# Patient Record
Sex: Female | Born: 1990
Health system: Southern US, Community
[De-identification: ages and names within clinical notes are randomized; demographics above are authoritative.]

## PROBLEM LIST (undated history)

## (undated) DIAGNOSIS — F419 Anxiety disorder, unspecified: Secondary | ICD-10-CM

## (undated) DIAGNOSIS — K219 Gastro-esophageal reflux disease without esophagitis: Secondary | ICD-10-CM

## (undated) HISTORY — DX: Anxiety disorder, unspecified: F41.9

## (undated) HISTORY — DX: Gastro-esophageal reflux disease without esophagitis: K21.9

## (undated) HISTORY — PX: COSMETIC SURGERY: SHX468

## (undated) HISTORY — PX: WISDOM TOOTH EXTRACTION: SHX21

---

## 2010-10-13 HISTORY — PX: BREAST REDUCTION SURGERY: SHX8

## 2019-07-11 LAB — HM PAP SMEAR

## 2019-10-04 ENCOUNTER — Ambulatory Visit: Payer: HRSA Program | Attending: Internal Medicine

## 2019-10-04 DIAGNOSIS — Z20828 Contact with and (suspected) exposure to other viral communicable diseases: Secondary | ICD-10-CM | POA: Insufficient documentation

## 2019-10-04 DIAGNOSIS — Z20822 Contact with and (suspected) exposure to covid-19: Secondary | ICD-10-CM

## 2019-10-05 LAB — NOVEL CORONAVIRUS, NAA: SARS-CoV-2, NAA: NOT DETECTED

## 2019-12-14 ENCOUNTER — Other Ambulatory Visit: Payer: Self-pay

## 2019-12-14 ENCOUNTER — Encounter: Payer: Self-pay | Admitting: Physician Assistant

## 2019-12-14 ENCOUNTER — Ambulatory Visit (INDEPENDENT_AMBULATORY_CARE_PROVIDER_SITE_OTHER): Payer: No Typology Code available for payment source | Admitting: Physician Assistant

## 2019-12-14 VITALS — BP 120/80 | HR 66 | Temp 97.7°F | Ht 69.25 in | Wt 195.2 lb

## 2019-12-14 DIAGNOSIS — F411 Generalized anxiety disorder: Secondary | ICD-10-CM | POA: Insufficient documentation

## 2019-12-14 DIAGNOSIS — Z23 Encounter for immunization: Secondary | ICD-10-CM | POA: Diagnosis not present

## 2019-12-14 DIAGNOSIS — F419 Anxiety disorder, unspecified: Secondary | ICD-10-CM | POA: Insufficient documentation

## 2019-12-14 NOTE — Progress Notes (Signed)
Haley Levine is a 29 y.o. female here to Establish care.  I acted as a Education administrator for [employer], PA-C Haley Pickler, LPN  History of Present Illness:   Chief Complaint  Patient presents with  . Establish Care  . Anxiety   Anxiety History of this. 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 She was on Zoloft 50 mg daily and has been off now x 2 months. She weaned herself off in Jan. She feels that she does not need medication at this time and feels like she has good coping skills. Sleeping well, no concerns currently or in the past. No prior talk therapy. No history of significant depression.  Triggers for anxiety: change, winter weather  Health Maintenance: Immunizations -- UTD, will give Tdap today Colonoscopy -- N/A Mammogram -- N/A PAP -- UTD, done at GYN 07/2019, normal per pt, will obtain report Bone Density -- N/A Weight -- Weight: 195 lb 4 oz (88.6 kg)   Depression screen [facility] 2/9 12/14/2019  Decreased Interest 0  Down, Depressed, Hopeless 0  PHQ - 2 Score 0    GAD 7 : Generalized Anxiety Score 12/14/2019  Nervous, Anxious, on Edge 0  Control/stop worrying 0  Worry too much - different things 1  Trouble relaxing 0  Restless 0  Easily annoyed or irritable 1  Afraid - awful might happen 0  Total GAD 7 Score 2  Anxiety Difficulty Not difficult at all   Other providers/specialists: Patient Care Team: Haley Coke, PA as PCP - General (Physician Assistant)   Past Medical History:  Diagnosis Date  . Anxiety   . GERD (gastroesophageal reflux disease)      Social History   Socioeconomic History  . Marital status: Married    Spouse name: Not on file  . Number of children: Not on file  . Years of education: Not on file  . Highest education level: Not on file  Occupational History  . Not on file  Tobacco Use  . Smoking status: Never Smoker  . Smokeless tobacco: Never Used  Substance and Sexual Activity  . Alcohol use: Yes    Comment: 3-4  drinks a week wine and beer  . Drug use: Never  . Sexual activity: Yes    Birth control/protection: Patch, Pill  Other Topics Concern  . 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 Difficulty of Paying Living Expenses: Not on file  Food Insecurity:   . Worried About Charity fundraiser in the Last Year: Not on file  . Ran Out of Food in the Last Year: Not on file  Transportation Needs:   . Lack of Transportation (Medical): Not on file  . Lack of Transportation (Non-Medical): Not on file  Physical Activity:   . Days of Exercise per Week: Not on file  . Minutes of Exercise per Session: Not on file  Stress:   . Feeling of Stress : Not on file  Social Connections:   . Frequency of Communication with Friends and Family: Not on file  . Frequency of Social Gatherings with Friends and Family: Not on file  . Attends Religious Services: Not on file  . Active Member of Clubs or Organizations: Not on file  . Attends Archivist Meetings: Not on file  . Marital Status: Not on file  Intimate Partner Violence:   .  Fear of Current or Ex-Partner: Not on file  . Emotionally Abused: Not on file  . Physically Abused: Not on file  . Sexually Abused: Not on file    Past Surgical History:  Procedure Laterality Date  . BREAST REDUCTION SURGERY Bilateral 2012    Family History  Problem Relation Age of Onset  . Osteoarthritis Mother   . Hypertension Mother   . Cancer Father   . Osteoarthritis Father   . Hyperlipidemia Father   . Cancer Maternal Grandfather        pancreatic  . Cancer Paternal Grandmother        colon  . Cancer Paternal Grandfather        breast, lung    No Known Allergies   Current Medications:   Current Outpatient Medications:  Marland Kitchen  Multiple Vitamin (MULTIVITAMIN) tablet, Take 1 tablet by mouth daily., Disp: , Rfl:  .  TRI-SPRINTEC 0.18/0.215/0.25 MG-35 MCG  tablet, Take 1 tablet by mouth daily., Disp: , Rfl:    Review of Systems:   ROS  Negative unless otherwise specified per HPI.  Vitals:   Vitals:   12/14/19 1425  BP: 120/80  Pulse: 66  Temp: 97.7 F (36.5 C)  TempSrc: Temporal  SpO2: 98%  Weight: 195 lb 4 oz (88.6 kg)  Height: 5' 9.25" (1.759 m)      Body mass index is 28.63 kg/m.  Physical Exam:   Physical Exam Vitals and nursing note reviewed.  Constitutional:      General: She is not in acute distress.    Appearance: She is well-developed. She is not ill-appearing or toxic-appearing.  Cardiovascular:     Rate and Rhythm: Normal rate and regular rhythm.     Pulses: Normal pulses.     Heart sounds: Normal heart sounds, S1 normal and S2 normal.     Comments: No LE edema Pulmonary:     Effort: Pulmonary effort is normal.     Breath sounds: Normal breath sounds.  Skin:    General: Skin is warm and dry.  Neurological:     Mental Status: She is alert.     GCS: GCS eye subscore is 4. GCS verbal subscore is 5. GCS motor subscore is 6.  Psychiatric:        Speech: Speech normal.        Behavior: Behavior normal. Behavior is cooperative.     Assessment and Plan:   Haley Levine was seen today for establish care and anxiety.  Diagnoses and all orders for this visit:  Generalized anxiety disorder Currently well controlled without medication. Follow-up as needed.  Need for prophylactic vaccination with combined diphtheria-tetanus-pertussis (DTP) vaccine -     Tdap vaccine greater than or equal to 7yo IM  . Reviewed expectations re: course of current medical issues. . Discussed self-management of symptoms. . Outlined signs and symptoms indicating need for more acute intervention. . Patient verbalized understanding and all questions were answered. . See orders for this visit as documented in the electronic medical record. . Patient received an After-Visit Summary.  CMA or LPN served as scribe during this visit.  History, Physical, and Plan performed by medical provider. The above documentation has been reviewed and is accurate and complete.  Haley Motto, PA-C

## 2019-12-21 ENCOUNTER — Telehealth: Payer: Self-pay | Admitting: Physician Assistant

## 2019-12-21 NOTE — Telephone Encounter (Signed)
[facility] HIM dept faxed request for medical records to [facility] 12/21/19

## 2020-02-21 ENCOUNTER — Encounter: Payer: Self-pay | Admitting: Physician Assistant

## 2020-02-22 ENCOUNTER — Other Ambulatory Visit: Payer: Self-pay | Admitting: Physician Assistant

## 2020-02-22 MED ORDER — SERTRALINE HCL 25 MG PO TABS: 25.0000 mg | ORAL_TABLET | Freq: Every day | ORAL | 1 refills | Status: DC

## 2020-03-28 MED FILL — SERTRALINE HCL 25 MG TABLET: 25 | 30 days supply | Qty: 30 | Fill #0

## 2020-04-03 MED FILL — PROMETHAZINE 12.5 MG TABLET: 12.5 | 20 days supply | Qty: 60 | Fill #0

## 2020-04-12 ENCOUNTER — Encounter: Payer: Self-pay | Admitting: Obstetrics & Gynecology

## 2020-04-18 LAB — OB RESULTS CONSOLE RPR: RPR: NONREACTIVE

## 2020-04-18 LAB — OB RESULTS CONSOLE GC/CHLAMYDIA
Chlamydia: NEGATIVE
Gonorrhea: NEGATIVE

## 2020-04-18 LAB — OB RESULTS CONSOLE HIV ANTIBODY (ROUTINE TESTING): HIV: NONREACTIVE

## 2020-04-18 LAB — OB RESULTS CONSOLE HEPATITIS B SURFACE ANTIGEN: Hepatitis B Surface Ag: NEGATIVE

## 2020-04-18 LAB — OB RESULTS CONSOLE ANTIBODY SCREEN: Antibody Screen: NEGATIVE

## 2020-04-18 LAB — OB RESULTS CONSOLE ABO/RH: RH Type: POSITIVE

## 2020-04-18 LAB — OB RESULTS CONSOLE RUBELLA ANTIBODY, IGM: Rubella: IMMUNE

## 2020-04-19 MED FILL — SCOPOLAMINE 1 MG/3DAYS PT72: 1 | 12 days supply | Qty: 4 | Fill #0

## 2020-04-23 MED FILL — PROMETHAZINE 12.5 MG TABLET: 12.5 | 20 days supply | Qty: 60 | Fill #1

## 2020-04-25 ENCOUNTER — Encounter: Payer: Self-pay | Admitting: Physician Assistant

## 2020-04-25 ENCOUNTER — Telehealth (INDEPENDENT_AMBULATORY_CARE_PROVIDER_SITE_OTHER): Payer: No Typology Code available for payment source | Admitting: Physician Assistant

## 2020-04-25 ENCOUNTER — Other Ambulatory Visit: Payer: Self-pay | Admitting: Physician Assistant

## 2020-04-25 VITALS — Ht 69.25 in | Wt 188.0 lb

## 2020-04-25 DIAGNOSIS — F411 Generalized anxiety disorder: Secondary | ICD-10-CM

## 2020-04-25 MED ORDER — SERTRALINE HCL 25 MG PO TABS: 25.0000 mg | ORAL_TABLET | Freq: Every day | ORAL | 1 refills | Status: DC

## 2020-04-25 MED FILL — SERTRALINE HCL 25 MG TABLET: 25 | 90 days supply | Qty: 90 | Fill #0

## 2020-04-25 NOTE — Progress Notes (Signed)
Virtual Visit via Video   I connected with Haley Levine on 04/25/20 at  4:00 PM EDT by a video enabled telemedicine application and verified that I am speaking with the correct person using two identifiers. Location patient: Home Location provider: [facility], Office Persons participating in the virtual visit: Haley Levine, Haley Pavlov PA-C,Donna Orphanos, LPN   I discussed the limitations of evaluation and management by telemedicine and the availability of in person appointments. The patient expressed understanding and agreed to proceed.  I acted as a Neurosurgeon for [employer], PA-C [employer], LPN   Subjective:   HPI:   Anxiety Pt following up, currently taking Zoloft 25 mg daily, was restarted on this in May 2021. Pt is feeling good on this dose and denies any side effects from medication. Denies SI/HI. Pt is pregnant and due in Feb. Having significant nausea/vomiting with her pregnancy thus far.  ROS: See pertinent positives and negatives per HPI.  GAD 7 : Generalized Anxiety Score 04/25/2020 12/14/2019  Nervous, Anxious, on Edge 0 0  Control/stop worrying 0 0  Worry too much - different things 0 1  Trouble relaxing 0 0  Restless 0 0  Easily annoyed or irritable 0 1  Afraid - awful might happen 0 0  Total GAD 7 Score 0 2  Anxiety Difficulty Not difficult at all Not difficult at all   ]  Patient Active Problem List   Diagnosis Date Noted  . Generalized anxiety disorder 12/14/2019    Social History   Tobacco Use  . Smoking status: Never Smoker  . Smokeless tobacco: Never Used  Substance Use Topics  . Alcohol use: Yes    Comment: 3-4 drinks a week wine and beer    Current Outpatient Medications:  Marland Kitchen  Multiple Vitamin (MULTIVITAMIN) tablet, Take 1 tablet by mouth daily., Disp: , Rfl:  .  promethazine (PHENERGAN) 12.5 MG tablet, Take 12.5 mg by mouth 3 (three) times daily., Disp: , Rfl:  .  sertraline (ZOLOFT) 25 MG tablet, Take 1 tablet  (25 mg total) by mouth daily., Disp: 90 tablet, Rfl: 1  No Known Allergies  Objective:   VITALS: Per patient if applicable, see vitals. GENERAL: Alert, appears well and in no acute distress. HEENT: Atraumatic, conjunctiva clear, no obvious abnormalities on inspection of external nose and ears. NECK: Normal movements of the head and neck. CARDIOPULMONARY: No increased WOB. Speaking in clear sentences. I:E ratio WNL.  MS: Moves all visible extremities without noticeable abnormality. PSYCH: Pleasant and cooperative, well-groomed. Speech normal rate and rhythm. Affect is appropriate. Insight and judgement are appropriate. Attention is focused, linear, and appropriate.  NEURO: CN grossly intact. Oriented as arrived to appointment on time with no prompting. Moves both UE equally.  SKIN: No obvious lesions, wounds, erythema, or cyanosis noted on face or hands.  Assessment and Plan:   [name] was seen today for anxiety.  Diagnoses and all orders for this visit:  Generalized anxiety disorder  Other orders -     sertraline (ZOLOFT) 25 MG tablet; Take 1 tablet (25 mg total) by mouth daily.   Overall doing well.  Continue current prescription.  Follow-up in 3 months if she would like to change dose, sooner if concerns.  . Reviewed expectations re: course of current medical issues. . Discussed self-management of symptoms. . Outlined signs and symptoms indicating need for more acute intervention. . Patient verbalized understanding and all questions were answered. Marland Kitchen Health Maintenance issues including appropriate healthy diet, exercise,  and smoking avoidance were discussed with patient. . See orders for this visit as documented in the electronic medical record.  I discussed the assessment and treatment plan with the patient. The patient was provided an opportunity to ask questions and all were answered. The patient agreed with the plan and demonstrated an understanding of the instructions.    The patient was advised to call back or seek an in-person evaluation if the symptoms worsen or if the condition fails to improve as anticipated.   CMA or LPN served as scribe during this visit. History, Physical, and Plan performed by medical provider. The above documentation has been reviewed and is accurate and complete.   [place], [place] 04/25/2020

## 2020-05-10 LAB — HM PAP SMEAR

## 2020-05-14 MED FILL — SCOPOLAMINE 1 MG/3DAYS PT72: 1 | 12 days supply | Qty: 4 | Fill #0

## 2020-05-16 MED FILL — PROMETHAZINE 12.5 MG TABLET: 12.5 | 20 days supply | Qty: 60 | Fill #2

## 2020-05-29 ENCOUNTER — Other Ambulatory Visit: Payer: Self-pay | Admitting: Obstetrics & Gynecology

## 2020-05-29 DIAGNOSIS — Z363 Encounter for antenatal screening for malformations: Secondary | ICD-10-CM

## 2020-05-29 DIAGNOSIS — Z3A18 18 weeks gestation of pregnancy: Secondary | ICD-10-CM

## 2020-05-29 DIAGNOSIS — R9389 Abnormal findings on diagnostic imaging of other specified body structures: Secondary | ICD-10-CM

## 2020-05-31 MED FILL — SCOPOLAMINE 1 MG/3DAYS PT72: 1 | 12 days supply | Qty: 4 | Fill #1

## 2020-06-14 MED FILL — PROMETHAZINE 12.5 MG TABLET: 12.5 | 20 days supply | Qty: 60 | Fill #0

## 2020-06-22 ENCOUNTER — Encounter: Payer: Self-pay | Admitting: *Deleted

## 2020-06-26 ENCOUNTER — Other Ambulatory Visit: Payer: Self-pay

## 2020-06-26 ENCOUNTER — Encounter: Payer: Self-pay | Admitting: *Deleted

## 2020-06-26 ENCOUNTER — Ambulatory Visit: Payer: Self-pay | Admitting: Genetic Counselor

## 2020-06-26 ENCOUNTER — Other Ambulatory Visit: Payer: Self-pay | Admitting: *Deleted

## 2020-06-26 ENCOUNTER — Ambulatory Visit: Payer: No Typology Code available for payment source | Admitting: *Deleted

## 2020-06-26 ENCOUNTER — Ambulatory Visit (HOSPITAL_BASED_OUTPATIENT_CLINIC_OR_DEPARTMENT_OTHER): Payer: No Typology Code available for payment source | Admitting: Genetic Counselor

## 2020-06-26 ENCOUNTER — Ambulatory Visit: Payer: No Typology Code available for payment source | Attending: Obstetrics & Gynecology

## 2020-06-26 VITALS — BP 129/68 | HR 89

## 2020-06-26 DIAGNOSIS — Z3A18 18 weeks gestation of pregnancy: Secondary | ICD-10-CM | POA: Insufficient documentation

## 2020-06-26 DIAGNOSIS — Z363 Encounter for antenatal screening for malformations: Secondary | ICD-10-CM | POA: Diagnosis not present

## 2020-06-26 DIAGNOSIS — Z315 Encounter for genetic counseling: Secondary | ICD-10-CM | POA: Diagnosis not present

## 2020-06-26 DIAGNOSIS — O285 Abnormal chromosomal and genetic finding on antenatal screening of mother: Secondary | ICD-10-CM | POA: Insufficient documentation

## 2020-06-26 DIAGNOSIS — O280 Abnormal hematological finding on antenatal screening of mother: Secondary | ICD-10-CM | POA: Insufficient documentation

## 2020-06-26 DIAGNOSIS — Z362 Encounter for other antenatal screening follow-up: Secondary | ICD-10-CM

## 2020-06-26 DIAGNOSIS — R9389 Abnormal findings on diagnostic imaging of other specified body structures: Secondary | ICD-10-CM | POA: Insufficient documentation

## 2020-06-26 NOTE — Progress Notes (Signed)
06/26/2020  Haley Levine Nov 20, 1990 MRN: [id number] DOV: 06/26/2020  Haley Levine presented to the [facility] for a genetics consultation regarding her abnormal noninvasive prenatal screening (NIPS) result. Haley Levine presented to her appointment alone.   Indication for genetic counseling - Atypical finding on sex chromosomes on NIPS  Prenatal history  Haley Levine is a G18P0, 29 y.o. female. Her current pregnancy has completed [redacted]w[redacted]d (Estimated Date of Delivery: 11/25/20).  Haley Levine denied exposure to environmental toxins or chemical agents. She denied the use of alcohol, tobacco or street drugs. She reported taking Zoloft and Phenergan. She denied significant viral illnesses, fevers, and bleeding during the course of her pregnancy. Her medical and surgical histories were noncontributory.  Family History  A three generation pedigree was drafted and reviewed. The family history is remarkable for the following:  - Haley Levine's father was diagnosed with Parkinson's disease at 29 years of age. Her paternal grandfather also reportedly had Parkinson's disease diagnosed at age 61. Approximately 10-15% of cases of Parkinson's disease are hereditary. Several genes associated with Parkinson's disease are inherited in an autosomal dominant pattern, which is consistent with Haley Levine's family history. If her father has a change in an autosomal dominant gene associated with Parkinson's, Haley Levine would have a 50% chance of inheriting this change and being at risk of developing Parkinson's disease herself. I offered Haley Levine a referral to adult genetics to discuss her family history of Parkinson's disease and genetic testing options that are available, which she declined at this time.  The remaining family histories were reviewed and found to be noncontributory for birth defects, intellectual disability, recurrent pregnancy loss, and known genetic conditions.    The  patient's ethnicity is [place]. The father of the pregnancy's ethnicity is Caucasian. Ashkenazi Jewish ancestry and consanguinity were denied. Pedigree will be scanned under Media.  Discussion  NIPS result:  HaleyO'Dellwas referred for a genetic counseling consultation as shehad Panoramanoninvasive prenatal screening (NIPS)through Haley Levine that demonstrated an atypical finding on the sex chromosomes. The laboratory was unable to determine if this finding was suspected to be maternal or fetal in nature. These results also showeda less than 1 in 10,000 risk for trisomies 21, 18 and 13, a low risk for triploidy, and a 1 in9000 risk for 22q11.2 deletion syndrome. A female fetus was predicted on NIPS.  We reviewed thatNIPSanalyzes cell-free fetal DNAfrom the placentafound in the maternal bloodstream duringpregnancy.NIPS is used to determine the risk for missing or extra fetal/placental chromosomal material for a specific subset of chromosomes.However, maternal DNA is also present in a NIPS sample. Based on this, we discussed that there are several possibilities that could warrant Haley Levine's abnormal NIPS result. One possibility is the fetus having a genetic change involving her sex chromosomes. Another possibility is Haley Levine herself having a genetic change involving her sex chromosomes. A second possibility is fetal or maternal mosaicism for a change involving the sex chromosomes. Mosaicism occurs when not every cell in the body is genetically identical; some cells in the body may have a genetic change involving the sex chromosomes, whereas others may have normal sex chromosomes. A third possibility is that of confined placental mosaicism, or a result representative of the placenta only rather than the fetus. Finally, it is possible that this is a false positive result.  We reviewed that even if the fetus were to have a sex chromosome abnormality, it would not be possible for [place] to predict if  this  would be a benign change or one that would have implications for health or development based on this NIPS result alone.We also reviewed the limitations of NIPS, including the fact that it is not diagnostic andthat itdoes not identify all genetic conditions. We discussed that without knowing the precisereason forthis abnormal NIPS result, we are limited in understanding if there may beany potentially clinically relevant concerns for the fetus.  Testing options:  We reviewed available testing options to attempt to get more information to clarify this finding. Firstly, Haley Levine was informed that diagnostic testing via amniocentesis would be the only way to determine if the fetus has a chromosomal abnormality involving the sex chromosomes prenatally. We discussed the technical aspects of the procedure and quoted up to a 1 in 500 (0.2%) risk for spontaneous pregnancy loss or other adverse pregnancy outcomes as a result of amniocentesis. Cultured cells from an amniocentesis sample allow for the visualization of a fetal karyotype, which can detect >99% of large chromosomal aberrations. Chromosomal microarray can also be performed to identify smaller deletions or duplications offetalchromosomal material that fall below the resolution of karyotype. Haley Levine was informed that if a sex chromosome abnormality were identified, we would then be able to research whether the finding is a benign change orone thatcould have implications for the fetus's postnatal health or development.   Haley Levine was also made aware that should amniocentesis not be desired, she can continue with standard ultrasounds to monitor for fetal anomalies. She may wait to pursue genetic testing postnatally if clinically indicated. Finally, Haley Levine was informed that we could consider pursuing a maternal karyotype to determine if she herself has a genetic change involving her sex chromosomes.  Ultrasound findings:  A complete  ultrasound was performed today following our visit. The ultrasound report will be sent under separate cover. There were no visualized fetal anomalies or markers suggestive of aneuploidy. However, suboptimal views of the fetal face were seen, with some images suggestive of a possible cleft lip and others appearing normal. Haley Levine will return for a follow-up ultrasound in four weeks to clarify whether or not the fetus has a cleft lip. We discussed that if the fetus does have a cleft lip, it may not be associated with her atypical NIPS result. Cleft lip can occur as an isolated defect, but also is associated with many genetic disorders. If cleft lip is confirmed at her future ultrasound, we can discuss this finding in more detail at that time.   Plan:  Haley Levine declined amniocentesis and maternal karyotype today. She wanted to discuss her testing options with her husband before making a firm decision about testing. However, she informed me that she would likely not consider amniocentesis unless some sort of physical deformity were identified on ultrasound. She is aware that amniocentesis remains an option throughout the end of her pregnancy and that she may opt to undergo the procedure at any time. She also understands that screening tests, including NIPS and ultrasound, cannot rule out all birth defects or genetic syndromes.   Following the session, I emailed Haley Levine a summary of what was discussed during today's session. I encouraged her to contact me if she decides that she would like to undergo further testing or if she or her husband have any additional questions.  I counseled Haley Levine regarding the above risks and available options. The approximate face-to-face time with the genetic counselor was 35 minutes.  In summary:  Discussed NIPS result   Atypical finding  on the sex chromosomes  Uncertain if finding is suspected to be placental/fetal or maternal in origin  Reduction in risk for  Down syndrome, trisomy 52, trisomy 68, triploidy, and 22q11.2 deletion syndrome  Offered additional testing and screening  Declined maternal karyotype & amniocentesis at this time  May consider undergoing amniocentesis if physical abnormality identified on ultrasound  Reviewed results of ultrasound  Suboptimal views of face with some images suggestive of possible cleft lip with others appearing normal  No other fetal anomalies or markers seen  Will return for a follow-up ultrasound in four weeks  Will discuss cleft lip in more detail if confirmed at future ultrasound  Reviewed family history concerns  Declined referral to adult genetics for family history of Parkinson's disease in her father and paternal grandfather   Gershon Crane, MS, [facility] Dentist

## 2020-07-24 ENCOUNTER — Ambulatory Visit: Payer: No Typology Code available for payment source | Attending: Maternal & Fetal Medicine

## 2020-07-24 ENCOUNTER — Other Ambulatory Visit: Payer: Self-pay

## 2020-07-24 DIAGNOSIS — O321XX0 Maternal care for breech presentation, not applicable or unspecified: Secondary | ICD-10-CM | POA: Diagnosis not present

## 2020-07-24 DIAGNOSIS — O289 Unspecified abnormal findings on antenatal screening of mother: Secondary | ICD-10-CM | POA: Diagnosis not present

## 2020-07-24 DIAGNOSIS — Z362 Encounter for other antenatal screening follow-up: Secondary | ICD-10-CM | POA: Diagnosis not present

## 2020-07-24 DIAGNOSIS — Z3A22 22 weeks gestation of pregnancy: Secondary | ICD-10-CM

## 2020-08-13 MED FILL — SERTRALINE HCL 25 MG TABLET: 25 | 90 days supply | Qty: 90 | Fill #1

## 2020-10-13 NOTE — L&D Delivery Note (Signed)
Delivery Note At 9:42 AM a viable female was delivered via Vaginal, Spontaneous (Presentation:   Occiput Anterior).  APGAR: 9, 9; weight  pending.   Placenta status: Spontaneous, Intact.  Cord: 3 vessels with the following complications: Nuchal x 1 - loose, delivered through  Cord pH: not sent  Anesthesia: Epidural Episiotomy: None Lacerations:  vaginal Suture Repair: 2.0 3.0 vicryl Est. Blood Loss (mL):  400cc   It's a girl - "Parker"!!   Mom to postpartum.  Baby to Couplet care / Skin to Skin.  Ranae Pila 11/26/2020, 10:11 AM

## 2020-10-30 LAB — OB RESULTS CONSOLE GBS: GBS: NEGATIVE

## 2020-11-14 ENCOUNTER — Encounter: Payer: Self-pay | Admitting: Physician Assistant

## 2020-11-14 ENCOUNTER — Other Ambulatory Visit: Payer: Self-pay | Admitting: Physician Assistant

## 2020-11-14 MED ORDER — SERTRALINE HCL 25 MG PO TABS: 25.0000 mg | ORAL_TABLET | Freq: Every day | ORAL | 1 refills | Status: DC

## 2020-11-14 MED FILL — SERTRALINE HCL 25 MG TABLET: 25 | 90 days supply | Qty: 90 | Fill #0

## 2020-11-14 NOTE — Telephone Encounter (Signed)
Okay to refill Zoloft?   Last OV 04/2020.

## 2020-11-21 ENCOUNTER — Telehealth (HOSPITAL_COMMUNITY): Payer: Self-pay | Admitting: *Deleted

## 2020-11-21 NOTE — Telephone Encounter (Signed)
Preadmission screen  

## 2020-11-22 ENCOUNTER — Encounter (HOSPITAL_COMMUNITY): Payer: Self-pay | Admitting: *Deleted

## 2020-11-22 ENCOUNTER — Telehealth (HOSPITAL_COMMUNITY): Payer: Self-pay | Admitting: *Deleted

## 2020-11-22 NOTE — Telephone Encounter (Signed)
Per EC Mullen IP if pt brings the information she provided to [facility] for her positive Covid test, she is not required to retest and can be off of isolation.

## 2020-11-22 NOTE — Telephone Encounter (Signed)
Preadmission screen  

## 2020-11-26 ENCOUNTER — Inpatient Hospital Stay (HOSPITAL_COMMUNITY)
Admission: AD | Admit: 2020-11-26 | Discharge: 2020-11-28 | DRG: 807 | Disposition: A | Discharge status: Home / Self Care | Payer: No Typology Code available for payment source | Attending: Obstetrics and Gynecology | Admitting: Obstetrics and Gynecology

## 2020-11-26 ENCOUNTER — Inpatient Hospital Stay (HOSPITAL_COMMUNITY): Payer: No Typology Code available for payment source | Admitting: Anesthesiology

## 2020-11-26 ENCOUNTER — Encounter (HOSPITAL_COMMUNITY): Payer: Self-pay | Admitting: Obstetrics and Gynecology

## 2020-11-26 ENCOUNTER — Other Ambulatory Visit: Payer: Self-pay

## 2020-11-26 DIAGNOSIS — Z3490 Encounter for supervision of normal pregnancy, unspecified, unspecified trimester: Secondary | ICD-10-CM

## 2020-11-26 DIAGNOSIS — Z20822 Contact with and (suspected) exposure to covid-19: Secondary | ICD-10-CM | POA: Diagnosis present

## 2020-11-26 DIAGNOSIS — Z3A40 40 weeks gestation of pregnancy: Secondary | ICD-10-CM | POA: Diagnosis not present

## 2020-11-26 DIAGNOSIS — O26893 Other specified pregnancy related conditions, third trimester: Secondary | ICD-10-CM | POA: Diagnosis present

## 2020-11-26 LAB — CBC
HCT: 37.9 % (ref 36.0–46.0)
Hemoglobin: 13 g/dL (ref 12.0–15.0)
MCH: 30.6 pg (ref 26.0–34.0)
MCHC: 34.3 g/dL (ref 30.0–36.0)
MCV: 89.2 fL (ref 80.0–100.0)
Platelets: 128 10*3/uL — ABNORMAL LOW (ref 150–400)
RBC: 4.25 MIL/uL (ref 3.87–5.11)
RDW: 13.4 % (ref 11.5–15.5)
WBC: 12.3 10*3/uL — ABNORMAL HIGH (ref 4.0–10.5)
nRBC: 0 % (ref 0.0–0.2)

## 2020-11-26 LAB — TYPE AND SCREEN
ABO/RH(D): AB POS
Antibody Screen: NEGATIVE

## 2020-11-26 LAB — RESP PANEL BY RT-PCR (FLU A&B, COVID) ARPGX2
Influenza A by PCR: NEGATIVE
Influenza B by PCR: NEGATIVE
SARS Coronavirus 2 by RT PCR: NEGATIVE

## 2020-11-26 LAB — RPR: RPR Ser Ql: NONREACTIVE

## 2020-11-26 MED ORDER — WITCH HAZEL-GLYCERIN EX PADS
1.0000 "application " | MEDICATED_PAD | CUTANEOUS | Status: DC | PRN
  Administered 2020-11-26 – 2020-11-27 (×2): 1 via TOPICAL

## 2020-11-26 MED ORDER — ONDANSETRON HCL 4 MG/2ML IJ SOLN: 4.0000 mg | INTRAMUSCULAR | Status: DC | PRN

## 2020-11-26 MED ORDER — OXYCODONE-ACETAMINOPHEN 5-325 MG PO TABS: 1.0000 | ORAL_TABLET | ORAL | Status: DC | PRN

## 2020-11-26 MED ORDER — OXYTOCIN-SODIUM CHLORIDE 30-0.9 UT/500ML-% IV SOLN
1.0000 m[IU]/min | INTRAVENOUS | Status: DC
  Administered 2020-11-26: 2 m[IU]/min via INTRAVENOUS

## 2020-11-26 MED ORDER — OXYCODONE-ACETAMINOPHEN 5-325 MG PO TABS
2.0000 | ORAL_TABLET | ORAL | Status: DC | PRN
Start: 2020-11-26 — End: 2020-11-26

## 2020-11-26 MED ORDER — ONDANSETRON HCL 4 MG PO TABS: 4.0000 mg | ORAL_TABLET | ORAL | Status: DC | PRN

## 2020-11-26 MED ORDER — DIPHENHYDRAMINE HCL 25 MG PO CAPS: 25.0000 mg | ORAL_CAPSULE | Freq: Four times a day (QID) | ORAL | Status: DC | PRN

## 2020-11-26 MED ORDER — PRENATAL MULTIVITAMIN CH
1.0000 | ORAL_TABLET | Freq: Every day | ORAL | Status: DC
  Filled 2020-11-26 (×2): qty 1

## 2020-11-26 MED ORDER — IBUPROFEN 600 MG PO TABS
600.0000 mg | ORAL_TABLET | Freq: Four times a day (QID) | ORAL | Status: DC
  Administered 2020-11-26 – 2020-11-28 (×9): 600 mg via ORAL
  Filled 2020-11-26 (×8): qty 1

## 2020-11-26 MED ORDER — TETANUS-DIPHTH-ACELL PERTUSSIS 5-2.5-18.5 LF-MCG/0.5 IM SUSY: 0.5000 mL | PREFILLED_SYRINGE | Freq: Once | INTRAMUSCULAR | Status: DC

## 2020-11-26 MED ORDER — ACETAMINOPHEN 325 MG PO TABS
650.0000 mg | ORAL_TABLET | ORAL | Status: DC | PRN
  Administered 2020-11-27 – 2020-11-28 (×5): 650 mg via ORAL
  Filled 2020-11-26 (×5): qty 2

## 2020-11-26 MED ORDER — SIMETHICONE 80 MG PO CHEW
80.0000 mg | CHEWABLE_TABLET | ORAL | Status: DC | PRN
Start: 2020-11-26 — End: 2020-11-28

## 2020-11-26 MED ORDER — BENZOCAINE-MENTHOL 20-0.5 % EX AERO: 1.0000 "application " | INHALATION_SPRAY | CUTANEOUS | Status: DC | PRN

## 2020-11-26 MED ORDER — LACTATED RINGERS IV SOLN: 500.0000 mL | Freq: Once | INTRAVENOUS | Status: DC

## 2020-11-26 MED ORDER — LACTATED RINGERS IV SOLN: INTRAVENOUS | Status: DC

## 2020-11-26 MED ORDER — HYDROXYZINE HCL 50 MG PO TABS: 50.0000 mg | ORAL_TABLET | Freq: Four times a day (QID) | ORAL | Status: DC | PRN

## 2020-11-26 MED ORDER — LIDOCAINE HCL (PF) 1 % IJ SOLN: 30.0000 mL | INTRAMUSCULAR | Status: DC | PRN

## 2020-11-26 MED ORDER — OXYCODONE HCL 5 MG PO TABS: 5.0000 mg | ORAL_TABLET | ORAL | Status: DC | PRN

## 2020-11-26 MED ORDER — FENTANYL-BUPIVACAINE-NACL 0.5-0.125-0.9 MG/250ML-% EP SOLN
12.0000 mL/h | EPIDURAL | Status: DC | PRN
  Filled 2020-11-26: qty 250

## 2020-11-26 MED ORDER — LIDOCAINE HCL (PF) 1 % IJ SOLN
INTRAMUSCULAR | Status: DC | PRN
  Administered 2020-11-26: 5 mL via EPIDURAL

## 2020-11-26 MED ORDER — ONDANSETRON HCL 4 MG/2ML IJ SOLN
4.0000 mg | Freq: Four times a day (QID) | INTRAMUSCULAR | Status: DC | PRN
Start: 2020-11-26 — End: 2020-11-26

## 2020-11-26 MED ORDER — PHENYLEPHRINE 40 MCG/ML (10ML) SYRINGE FOR IV PUSH (FOR BLOOD PRESSURE SUPPORT): 80.0000 ug | PREFILLED_SYRINGE | INTRAVENOUS | Status: DC | PRN

## 2020-11-26 MED ORDER — OXYCODONE HCL 5 MG PO TABS: 10.0000 mg | ORAL_TABLET | ORAL | Status: DC | PRN

## 2020-11-26 MED ORDER — COCONUT OIL OIL
1.0000 "application " | TOPICAL_OIL | Status: DC | PRN
  Administered 2020-11-28: 1 via TOPICAL

## 2020-11-26 MED ORDER — BUPIVACAINE HCL (PF) 0.25 % IJ SOLN
INTRAMUSCULAR | Status: DC | PRN
  Administered 2020-11-26: 5 mL via EPIDURAL

## 2020-11-26 MED ORDER — EPHEDRINE 5 MG/ML INJ: 10.0000 mg | INTRAVENOUS | Status: DC | PRN

## 2020-11-26 MED ORDER — SERTRALINE HCL 25 MG PO TABS
25.0000 mg | ORAL_TABLET | Freq: Every day | ORAL | Status: DC
  Administered 2020-11-26 – 2020-11-27 (×2): 25 mg via ORAL
  Filled 2020-11-26 (×2): qty 1

## 2020-11-26 MED ORDER — LACTATED RINGERS IV SOLN: 500.0000 mL | INTRAVENOUS | Status: DC | PRN

## 2020-11-26 MED ORDER — DIPHENHYDRAMINE HCL 50 MG/ML IJ SOLN: 12.5000 mg | INTRAMUSCULAR | Status: DC | PRN

## 2020-11-26 MED ORDER — OXYTOCIN BOLUS FROM INFUSION
333.0000 mL | Freq: Once | INTRAVENOUS | Status: AC
  Administered 2020-11-26: 333 mL via INTRAVENOUS

## 2020-11-26 MED ORDER — TERBUTALINE SULFATE 1 MG/ML IJ SOLN: 0.2500 mg | Freq: Once | INTRAMUSCULAR | Status: DC | PRN

## 2020-11-26 MED ORDER — BUTORPHANOL TARTRATE 1 MG/ML IJ SOLN: 1.0000 mg | INTRAMUSCULAR | Status: DC | PRN

## 2020-11-26 MED ORDER — SENNOSIDES-DOCUSATE SODIUM 8.6-50 MG PO TABS
2.0000 | ORAL_TABLET | ORAL | Status: DC
  Administered 2020-11-26 – 2020-11-27 (×2): 2 via ORAL
  Filled 2020-11-26 (×2): qty 2

## 2020-11-26 MED ORDER — ACETAMINOPHEN 325 MG PO TABS: 650.0000 mg | ORAL_TABLET | ORAL | Status: DC | PRN

## 2020-11-26 MED ORDER — DIBUCAINE (PERIANAL) 1 % EX OINT: 1.0000 "application " | TOPICAL_OINTMENT | CUTANEOUS | Status: DC | PRN

## 2020-11-26 MED ORDER — ZOLPIDEM TARTRATE 5 MG PO TABS: 5.0000 mg | ORAL_TABLET | Freq: Every evening | ORAL | Status: DC | PRN

## 2020-11-26 MED ORDER — FENTANYL-BUPIVACAINE-NACL 0.5-0.125-0.9 MG/250ML-% EP SOLN
EPIDURAL | Status: DC | PRN
  Administered 2020-11-26: 12 mL/h via EPIDURAL

## 2020-11-26 MED ORDER — PHENYLEPHRINE 40 MCG/ML (10ML) SYRINGE FOR IV PUSH (FOR BLOOD PRESSURE SUPPORT)
80.0000 ug | PREFILLED_SYRINGE | INTRAVENOUS | Status: DC | PRN
  Filled 2020-11-26: qty 10

## 2020-11-26 MED ORDER — SOD CITRATE-CITRIC ACID 500-334 MG/5ML PO SOLN: 30.0000 mL | ORAL | Status: DC | PRN

## 2020-11-26 MED ORDER — OXYTOCIN-SODIUM CHLORIDE 30-0.9 UT/500ML-% IV SOLN
2.5000 [IU]/h | INTRAVENOUS | Status: DC
  Filled 2020-11-26: qty 500

## 2020-11-26 NOTE — Anesthesia Preprocedure Evaluation (Signed)
Anesthesia Evaluation  Patient identified by MRN, date of birth, ID band Patient awake    Reviewed: Allergy & Precautions, Patient's Chart, lab work & pertinent test results  Airway Mallampati: II  TM Distance: >3 FB Neck ROM: Full    Dental no notable dental hx. (+) Teeth Intact, Dental Advisory Given   Pulmonary neg pulmonary ROS,    Pulmonary exam normal breath sounds clear to auscultation       Cardiovascular Exercise Tolerance: Good Normal cardiovascular exam Rhythm:Regular Rate:Normal     Neuro/Psych Anxiety negative neurological ROS     GI/Hepatic negative GI ROS, Neg liver ROS,   Endo/Other  negative endocrine ROS  Renal/GU negative Renal ROS     Musculoskeletal   Abdominal   Peds  Hematology  (+) Blood dyscrasia, , Lab Results      Component                Value               Date                      WBC                      12.3 (H)            11/26/2020                HGB                      13.0                11/26/2020                HCT                      37.9                11/26/2020                MCV                      89.2                11/26/2020                PLT                      128 (L)             11/26/2020              Anesthesia Other Findings   Reproductive/Obstetrics (+) Pregnancy                             Anesthesia Physical Anesthesia Plan  ASA: II  Anesthesia Plan: Epidural   Post-op Pain Management:    Induction:   PONV Risk Score and Plan:   Airway Management Planned:   Additional Equipment:   Intra-op Plan:   Post-operative Plan:   Informed Consent: I have reviewed the patients History and Physical, chart, labs and discussed the procedure including the risks, benefits and alternatives for the proposed anesthesia with the patient or authorized representative who has indicated his/her understanding and acceptance.        Plan Discussed with:   Anesthesia Plan Comments: (40.1 wk G1P0 w  Gestational thrombocytopenia 128) for LEA)        Anesthesia Quick Evaluation

## 2020-11-26 NOTE — Progress Notes (Signed)
Pt called out with feeling a gush of fluid.  Heavy show noted.  A few small clots noted in show.

## 2020-11-26 NOTE — Progress Notes (Signed)
C/c/+2, bloody show noted. No active bleeding. push

## 2020-11-26 NOTE — Anesthesia Postprocedure Evaluation (Signed)
Anesthesia Post Note  Patient: Haley Levine  Procedure(s) Performed: AN AD HOC LABOR EPIDURAL     Patient location during evaluation: Mother Baby Anesthesia Type: Epidural Level of consciousness: awake and alert Pain management: pain level controlled Vital Signs Assessment: post-procedure vital signs reviewed and stable Respiratory status: spontaneous breathing, nonlabored ventilation and respiratory function stable Cardiovascular status: stable Postop Assessment: no headache, no backache, epidural receding, no apparent nausea or vomiting, patient able to bend at knees, adequate PO intake and able to ambulate Anesthetic complications: no   No complications documented.  Last Vitals:  Vitals:   11/26/20 1145 11/26/20 1317  BP: 105/73 129/74  Pulse: 63 69  Resp: 16 18  Temp: 37.1 C 36.7 C  SpO2:  100%    Last Pain:  Vitals:   11/26/20 1317  TempSrc: Oral  PainSc: 0-No pain   Pain Goal:                   [employer]

## 2020-11-26 NOTE — Progress Notes (Signed)
Haley Levine was referred for history of depression/anxiety. * Referral screened out by Clinical Social Worker because none of the following criteria appear to apply: ~ History of anxiety/depression during this pregnancy, or of post-partum depression following prior delivery. ~ Diagnosis of anxiety and/or depression within last 3 years OR * Haley Levine's symptoms currently being treated with medication and/or therapy. Haley Levine is currently taking Zoloft; no concerns noted in OB records.   Please contact the Clinical Social Worker if needs arise, by Haley Levine request, or if Haley Levine scores greater than 9/yes to question 10 on [place] Postpartum Depression Screen.  Kamarie Palma Boyd-Gilyard, MSW, LCSW Clinical Social Work [phone] 

## 2020-11-26 NOTE — MAU Note (Signed)
PT SAYS SROM AT 0130 - CLEAR FLUID . BABY MOVES. UC STRONG SINCE 0200. PNC WITH DR MORRIS.  VE  2 CM ON Monday.  DENIES HSV AND MRSA. GBS- NEG

## 2020-11-26 NOTE — H&P (Signed)
OB History and Physical   Haley Levine is a 30 y.o. female G1P0 presenting for SROM at [redacted]w[redacted]d. Fluid noted at 0130, followed by contractions.   Pregnancy course is notable for abnormal NIPS testing with "atypical finding on sex chromosomes" and referral to MFM was made. NIPS reports gender as female which is consistent with [place]. Her initial [place] was notable for possible cleft lip, although follow up US did not demonstrate any cleft lip.    GBS negative, Rh positive.    OB History    Gravida  1   Para      Term      Preterm      AB      Living  0     SAB      IAB      Ectopic      Multiple  0   Live Births             Past Medical History:  Diagnosis Date  . Anxiety   . GERD (gastroesophageal reflux disease)    Past Surgical History:  Procedure Laterality Date  . BREAST REDUCTION SURGERY Bilateral 2012  . WISDOM TOOTH EXTRACTION     Family History: family history includes Cancer in her father, maternal grandfather, paternal grandfather, and paternal grandmother; Hyperlipidemia in her father; Hypertension in her mother; Osteoarthritis in her father and mother. Social History:  reports that she has never smoked. She has never used smokeless tobacco. She reports previous alcohol use. She reports that she does not use drugs.     Maternal Diabetes: No Genetic Screening: atypical sex chromosomes  Maternal Ultrasounds/Referrals: MFM referral for NIPS finding.  Possible cleft lip on anatomy scan, no cleft lip seen on follow up Maternal Substance Abuse:  No Significant Maternal Medications:  None Significant Maternal Lab Results:  Group B Strep negative Other Comments:  None  Review of Systems History Dilation: 4 Effacement (%): 70 Station: -2 Exam by:: DCALLAWAY, RN Blood pressure 129/77, pulse 79, temperature 97.7 F (36.5 C), temperature source Oral, resp. rate 18, height 5\' 10"  (1.778 m), weight 92.8 kg, last menstrual period 02/12/2020, unknown if  currently breastfeeding. Exam Physical Exam  Prenatal labs: ABO, Rh: AB/Positive/-- (07/07 0000) Antibody: Negative (07/07 0000) Rubella: Immune (07/07 0000) RPR: Nonreactive (07/07 0000)  HBsAg: Negative (07/07 0000)  HIV: Non-reactive (07/07 0000)  GBS:     Assessment/Plan: . Admit to Labor and Delivery . FHT cat 1, ctx q 4 min . Epidural when desired . Anticipate vaginal delivery  05-24-1983 11/26/2020, 4:07 AM

## 2020-11-26 NOTE — Progress Notes (Signed)
Epidural line never charted by anesthesiologist.  Catheter removed at 1050.  Tip intact

## 2020-11-27 ENCOUNTER — Other Ambulatory Visit (HOSPITAL_COMMUNITY): Payer: No Typology Code available for payment source

## 2020-11-27 LAB — CBC
HCT: 30.8 % — ABNORMAL LOW (ref 36.0–46.0)
Hemoglobin: 11 g/dL — ABNORMAL LOW (ref 12.0–15.0)
MCH: 31.9 pg (ref 26.0–34.0)
MCHC: 35.7 g/dL (ref 30.0–36.0)
MCV: 89.3 fL (ref 80.0–100.0)
Platelets: 124 10*3/uL — ABNORMAL LOW (ref 150–400)
RBC: 3.45 MIL/uL — ABNORMAL LOW (ref 3.87–5.11)
RDW: 13.6 % (ref 11.5–15.5)
WBC: 11 10*3/uL — ABNORMAL HIGH (ref 4.0–10.5)
nRBC: 0 % (ref 0.0–0.2)

## 2020-11-27 LAB — BIRTH TISSUE RECOVERY COLLECTION (PLACENTA DONATION)

## 2020-11-27 NOTE — Lactation Note (Signed)
This note was copied from a baby's chart. Lactation Consultation Note  Patient Name: Haley Levine [id number] Date: 11/27/2020   Age:30 hours   LC Initial Visit:  Mother did not have a lactation consult order in.  I did notice she was a [employer] employee so asked her if she would like a consult.  Mother politely declined at this time stating that her baby was feeding well.  She has already received her DEBP from the insurance company and it was shipped to her home address.  Father and family member present.  RN updated.   Maternal Data    Feeding Mother's Current Feeding Choice: Breast Milk  LATCH Score                    Lactation Tools Discussed/Used    Interventions    Discharge    Consult Status      [name] 11/27/2020, 3:29 PM

## 2020-11-27 NOTE — Progress Notes (Signed)
Post Partum Day 1 Subjective: no complaints, up ad lib, voiding, tolerating PO and + flatus  Objective: Blood pressure 127/81, pulse 69, temperature 98.6 F (37 C), temperature source Oral, resp. rate 15, height 5\' 10"  (1.778 m), weight 92.8 kg, last menstrual period 02/12/2020, SpO2 100 %, unknown if currently breastfeeding.  Physical Exam:  General: alert, cooperative and no distress Lochia: appropriate Uterine Fundus: firm Incision: healing well DVT Evaluation: No evidence of DVT seen on physical exam.  Recent Labs    11/26/20 0419 11/27/20 0522  HGB 13.0 11.0*  HCT 37.9 30.8*    Assessment/Plan: Plan for discharge tomorrow Baby has elevated bili to be repeated today D/W discharge instructions in case patient and baby are discharged later today  LOS: 1 day   11/29/20 II 11/27/2020, 9:33 AM

## 2020-11-28 NOTE — Discharge Summary (Signed)
Postpartum Discharge Summary  Date of Service November 28, 2020     Patient Name: Haley Levine DOB: [birth year] MRN: [id number]  Date of admission: 11/26/2020 Delivery date:11/26/2020  Delivering provider: Tyson Dense  Date of discharge: 11/28/2020  Admitting diagnosis: Pregnancy [Z34.90] Intrauterine pregnancy: [redacted]w[redacted]d    Secondary diagnosis:  Active Problems:   Pregnancy  Additional problems: none    Discharge diagnosis: Term Pregnancy Delivered                                              Post partum procedures:none Augmentation: Pitocin Complications: None  Hospital course: Onset of Labor With Vaginal Delivery      30y.o. yo G1P1001 at 30w1das admitted in Active Labor on 11/26/2020. Patient had an uncomplicated labor course as follows:  Membrane Rupture Time/Date: 1:30 AM ,11/26/2020   Delivery Method:Vaginal, Spontaneous  Episiotomy: None  Lacerations:  Labial;Vaginal  Patient had an uncomplicated postpartum course.  She is ambulating, tolerating a regular diet, passing flatus, and urinating well. Patient is discharged home in stable condition on 11/28/20.  Newborn Data: Birth date:11/26/2020  Birth time:9:42 AM  Gender:Female  Living status:Living  Apgars:9 ,9  Weight:3076 g   Magnesium Sulfate received: No BMZ received: No Rhophylac:No MMR:No T-DaP:Given prenatally Flu: No Transfusion:No  Physical exam  Vitals:   11/27/20 0418 11/27/20 1623 11/27/20 1946 11/28/20 0533  BP: 127/81 120/67 120/70 119/65  Pulse: 69 70 79 87  Resp: 15 17 16 18   Temp: 98.6 F (37 C) 97.6 F (36.4 C) 98 F (36.7 C) 98 F (36.7 C)  TempSrc: Oral Oral Oral   SpO2: 100% 99% 99%   Weight:      Height:       General: alert, cooperative and no distress Lochia: appropriate Uterine Fundus: firm Incision: Healing well with no significant drainage DVT Evaluation: No evidence of DVT seen on physical exam. Labs: Lab Results  Component Value Date   WBC  11.0 (H) 11/27/2020   HGB 11.0 (L) 11/27/2020   HCT 30.8 (L) 11/27/2020   MCV 89.3 11/27/2020   PLT 124 (L) 11/27/2020   No flowsheet data found. Edinburgh Score: Edinburgh Postnatal Depression Scale Screening Tool 11/26/2020  I have been able to laugh and see the funny side of things. 0  I have looked forward with enjoyment to things. 0  I have blamed myself unnecessarily when things went wrong. 0  I have been anxious or worried for no good reason. 1  I have felt scared or panicky for no good reason. 1  Things have been getting on top of me. 0  I have been so unhappy that I have had difficulty sleeping. 0  I have felt sad or miserable. 0  I have been so unhappy that I have been crying. 0  The thought of harming myself has occurred to me. 0  Edinburgh Postnatal Depression Scale Total 2      After visit meds:  Allergies as of 11/28/2020   No Known Allergies     Medication List    TAKE these medications   sertraline 25 MG tablet Commonly known as: Zoloft Take 1 tablet (25 mg total) by mouth daily.        Discharge home in stable condition Infant Feeding: Breast Infant Disposition:home with mother Discharge instruction: per After Visit Summary and  Postpartum booklet. Activity: Advance as tolerated. Pelvic rest for 6 weeks.  Diet: routine diet Anticipated Birth Control: Unsure Postpartum Appointment:6 weeks Additional Postpartum F/U: none Future Appointments:No future appointments. Follow up Visit:      11/28/2020 Cyril Mourning, MD

## 2020-11-29 ENCOUNTER — Inpatient Hospital Stay (HOSPITAL_COMMUNITY)
Admission: AD | Admit: 2020-11-29 | Payer: No Typology Code available for payment source | Source: Home / Self Care | Admitting: Obstetrics and Gynecology

## 2020-11-29 ENCOUNTER — Inpatient Hospital Stay (HOSPITAL_COMMUNITY): Payer: No Typology Code available for payment source

## 2020-12-08 ENCOUNTER — Encounter: Payer: Self-pay | Admitting: Physician Assistant

## 2020-12-10 ENCOUNTER — Other Ambulatory Visit: Payer: Self-pay | Admitting: Physician Assistant

## 2020-12-10 MED ORDER — SERTRALINE HCL 50 MG PO TABS: 50.0000 mg | ORAL_TABLET | Freq: Every day | ORAL | 3 refills | Status: DC

## 2021-01-08 ENCOUNTER — Other Ambulatory Visit (HOSPITAL_COMMUNITY): Payer: Self-pay | Admitting: Obstetrics & Gynecology

## 2021-01-14 ENCOUNTER — Other Ambulatory Visit (HOSPITAL_COMMUNITY): Payer: Self-pay

## 2021-01-14 MED FILL — Sertraline HCl Tab 50 MG: ORAL | 90 days supply | Qty: 90 | Fill #0 | Status: AC

## 2021-01-15 ENCOUNTER — Other Ambulatory Visit (HOSPITAL_COMMUNITY): Payer: Self-pay

## 2021-01-15 MED FILL — Norgestimate & Ethinyl Estradiol Tab 0.25 MG-35 MCG: ORAL | 84 days supply | Qty: 84 | Fill #0 | Status: AC

## 2021-03-12 ENCOUNTER — Encounter: Payer: Self-pay | Admitting: Physician Assistant

## 2021-04-16 ENCOUNTER — Other Ambulatory Visit (HOSPITAL_COMMUNITY): Payer: Self-pay

## 2021-04-16 MED FILL — Sertraline HCl Tab 50 MG: ORAL | 90 days supply | Qty: 90 | Fill #1 | Status: AC

## 2021-05-12 MED FILL — Norgestimate & Ethinyl Estradiol Tab 0.25 MG-35 MCG: ORAL | 84 days supply | Qty: 84 | Fill #1 | Status: AC

## 2021-05-13 ENCOUNTER — Other Ambulatory Visit (HOSPITAL_COMMUNITY): Payer: Self-pay

## 2021-05-27 ENCOUNTER — Other Ambulatory Visit (HOSPITAL_COMMUNITY): Payer: Self-pay

## 2021-05-27 MED ORDER — CARESTART COVID-19 HOME TEST VI KIT
PACK | 0 refills | Status: DC
  Filled 2021-05-27: qty 2, 2d supply, fill #0

## 2021-06-27 ENCOUNTER — Other Ambulatory Visit: Payer: Self-pay | Admitting: Pharmacist

## 2021-06-27 ENCOUNTER — Other Ambulatory Visit (HOSPITAL_COMMUNITY): Payer: Self-pay

## 2021-06-27 MED ORDER — QUICKVUE AT-HOME COVID-19 TEST VI KIT
PACK | 0 refills | Status: DC
  Filled 2021-06-27: qty 4, 4d supply, fill #0

## 2021-06-29 ENCOUNTER — Ambulatory Visit (HOSPITAL_COMMUNITY): Payer: No Typology Code available for payment source

## 2021-06-30 ENCOUNTER — Other Ambulatory Visit: Payer: Self-pay

## 2021-06-30 ENCOUNTER — Ambulatory Visit (HOSPITAL_COMMUNITY)
Admission: EM | Admit: 2021-06-30 | Discharge: 2021-06-30 | Disposition: A | Discharge status: Home / Self Care | Payer: No Typology Code available for payment source | Attending: Emergency Medicine | Admitting: Emergency Medicine

## 2021-06-30 ENCOUNTER — Encounter (HOSPITAL_COMMUNITY): Payer: Self-pay | Admitting: *Deleted

## 2021-06-30 DIAGNOSIS — J069 Acute upper respiratory infection, unspecified: Secondary | ICD-10-CM | POA: Diagnosis not present

## 2021-06-30 DIAGNOSIS — H9203 Otalgia, bilateral: Secondary | ICD-10-CM | POA: Insufficient documentation

## 2021-06-30 DIAGNOSIS — J029 Acute pharyngitis, unspecified: Secondary | ICD-10-CM | POA: Insufficient documentation

## 2021-06-30 LAB — POCT RAPID STREP A, ED / UC: Streptococcus, Group A Screen (Direct): NEGATIVE

## 2021-06-30 MED ORDER — CEPACOL INSTAMAX 15-20 MG MT LOZG: 1.0000 | LOZENGE | Freq: Four times a day (QID) | OROMUCOSAL | 0 refills | Status: DC | PRN

## 2021-06-30 NOTE — ED Provider Notes (Signed)
[facility]    CSN: [id number] Arrival date & time: 06/30/21  1009      History   Chief Complaint Chief Complaint  Patient presents with   Sore Throat    HPI Haley Levine is a 30 y.o. female.   HPI Haley Levine is a 30 y.o. female presenting to UC with c/o gradually worsening sore throat, bilateral ear pain for 5 days with hoarse voice that started today.  She has been able to keep fluids down. Pain improves with tylenol and motrin PRN.  Denies fever, chills, n/v/d. No HA or abdominal pain. She notes her 66moold daughter is on her 3rd round of antibiotics for an ear infection, no other sick contacts. She has taken 3 at home COVID tests, last one was 2 days ago, they have all been negative.      Past Medical History:  Diagnosis Date   Anxiety    GERD (gastroesophageal reflux disease)     Patient Active Problem List   Diagnosis Date Noted   Pregnancy 11/26/2020   Generalized anxiety disorder 12/14/2019    Past Surgical History:  Procedure Laterality Date   BREAST REDUCTION SURGERY Bilateral 2012   WISDOM TOOTH EXTRACTION      OB History     Gravida  1   Para  1   Term  1   Preterm      AB      Living  1      SAB      IAB      Ectopic      Multiple  0   Live Births  1            Home Medications    Prior to Admission medications   Medication Sig Start Date End Date Taking? Authorizing Provider  Acetaminophen (TYLENOL PO) Take by mouth.   Yes [provider]  Benzocaine-Menthol (CEPACOL INSTAMAX) 15-20 MG LOZG Use as directed 1 lozenge in the mouth or throat 4 (four) times daily as needed. 06/30/21  Yes Taesha Goodell O, PA-C  IBUPROFEN PO Take by mouth.   Yes [provider]  norgestimate-ethinyl estradiol (ORTHO-CYCLEN) 0.25-35 MG-MCG tablet TAKE 1 TABLET BY MOUTH ONCE DAILY 01/08/21 01/08/22 Yes Morris, Megan, DO  sertraline (ZOLOFT) 50 MG tablet TAKE 1 TABLET (50 MG TOTAL) BY MOUTH AT BEDTIME.  12/10/20 12/10/21 Yes Worley, SAldona Bar PA  [facility] (QUICKVUE AT-HOME COVID-19 TEST) KIT USE AS DIRECTED. 06/27/21   [facility]Jefm Bryant [facility]   Family History Family History  Problem Relation Age of Onset   Osteoarthritis Mother    Hypertension Mother    Cancer Father    Osteoarthritis Father    Hyperlipidemia Father    Cancer Maternal Grandfather        pancreatic   Cancer Paternal Grandmother        colon   Cancer Paternal Grandfather        breast, lung    Social History Social History   Tobacco Use   Smoking status: Never   Smokeless tobacco: Never  Vaping Use   Vaping Use: Never used  Substance Use Topics   Alcohol use: Yes    Comment: socially   Drug use: Never     Allergies   Patient has no known allergies.   Review of Systems Review of Systems  Constitutional:  Negative for appetite change, chills and fever.  HENT:  Positive for ear pain, sore throat  and voice change. Negative for congestion, sinus pain and trouble swallowing.   Respiratory:  Negative for cough, shortness of breath and wheezing.   Gastrointestinal:  Negative for abdominal pain, diarrhea, nausea and vomiting.  Musculoskeletal:  Negative for arthralgias and myalgias.  Skin:  Negative for rash.    Physical Exam Triage Vital Signs ED Triage Vitals  Enc Vitals Group     BP 06/30/21 1049 124/80     Pulse Rate 06/30/21 1049 72     Resp --      Temp 06/30/21 1049 98.3 F (36.8 C)     Temp Source 06/30/21 1049 Oral     SpO2 06/30/21 1049 98 %     Weight --      Height --      Head Circumference --      Peak Flow --      Pain Score 06/30/21 1056 6     Pain Loc --      Pain Edu? --      Excl. in [place]? --    No data found.  Updated Vital Signs BP 124/80 (BP Location: Left Arm)   Pulse 72   Temp 98.3 F (36.8 C) (Oral)   LMP 06/07/2021 (Approximate)   SpO2 98%   Breastfeeding No   Visual Acuity Right Eye Distance:   Left Eye Distance:   Bilateral Distance:     Right Eye Near:   Left Eye Near:    Bilateral Near:     Physical Exam Vitals and nursing note reviewed.  Constitutional:      General: She is not in acute distress.    Appearance: She is well-developed. She is not ill-appearing, toxic-appearing or diaphoretic.  HENT:     Head: Normocephalic and atraumatic.     Right Ear: Tympanic membrane and ear canal normal.     Left Ear: Tympanic membrane and ear canal normal.     Mouth/Throat:     Mouth: Mucous membranes are moist.     Pharynx: Posterior oropharyngeal erythema present.     Tonsils: No tonsillar exudate or tonsillar abscesses. 2+ on the right. 2+ on the left.  Cardiovascular:     Rate and Rhythm: Normal rate and regular rhythm.  Pulmonary:     Effort: Pulmonary effort is normal.  Musculoskeletal:        General: Normal range of motion.     Cervical back: Normal range of motion and neck supple.  Lymphadenopathy:     Cervical: Cervical adenopathy present.  Skin:    General: Skin is warm and dry.  Neurological:     Mental Status: She is alert and oriented to person, place, and time.  Psychiatric:        Behavior: Behavior normal.     UC Treatments / Results  Labs (all labs ordered are listed, but only abnormal results are displayed) Labs Reviewed  CULTURE, GROUP A STREP [facility])  POCT RAPID STREP A, ED / UC    EKG   Radiology No results found.  Procedures Procedures (including critical care time)  Medications Ordered in UC Medications - No data to display  Initial Impression / Assessment and Plan / UC Course  I have reviewed the triage vital signs and the nursing notes.  Pertinent labs & imaging results that were available during my care of the patient were reviewed by me and considered in my medical decision making (see chart for details).     URI symptoms for 5 days, negative home  covid tests. No cough. TMs: normal, no evidence of AOM or otitis externa  Tonsillar erythema noted on exam Rapid  strep: NEGATIVE Culture pending  Rx: cepacol F/u with PCP in 1 week if not improving, sooner if worsening. Final Clinical Impressions(s) / UC Diagnoses   Final diagnoses:  Sore throat  Acute pharyngitis, unspecified etiology  Viral upper respiratory infection  Acute ear pain, bilateral     Discharge Instructions       You may take 515m acetaminophen every 4-6 hours or in combination with ibuprofen 400-6068mevery 6-8 hours as needed for pain, inflammation, and fever.  Be sure to well hydrated with clear liquids and get at least 8 hours of sleep at night, preferably more while sick.   Please follow up with family medicine in 1 week if needed.      ED Prescriptions     Medication Sig Dispense Auth. Provider   Benzocaine-Menthol (CEPACOL INSTAMAX) 15-20 MG LOZG Use as directed 1 lozenge in the mouth or throat 4 (four) times daily as needed. 16 lozenge PhNoe GensPAVermont    PDMP not reviewed this encounter.   PhNoe GensPAVermont9/18/22 1126

## 2021-06-30 NOTE — Discharge Instructions (Addendum)
  You may take 500mg acetaminophen every 4-6 hours or in combination with ibuprofen 400-600mg every 6-8 hours as needed for pain, inflammation, and fever.  Be sure to well hydrated with clear liquids and get at least 8 hours of sleep at night, preferably more while sick.   Please follow up with family medicine in 1 week if needed.   

## 2021-06-30 NOTE — ED Triage Notes (Addendum)
C/O sore throat with bilat ear pain onset 5 days ago without fevers.  Today also has hoarse voice.  Denies any nasal congestion or runny nose. States has had 3 negative home Covid tests.

## 2021-07-01 ENCOUNTER — Encounter: Payer: Self-pay | Admitting: Physician Assistant

## 2021-07-01 ENCOUNTER — Telehealth (INDEPENDENT_AMBULATORY_CARE_PROVIDER_SITE_OTHER): Payer: No Typology Code available for payment source | Admitting: Family Medicine

## 2021-07-01 ENCOUNTER — Encounter: Payer: Self-pay | Admitting: Family Medicine

## 2021-07-01 VITALS — BP 120/70 | Wt 188.0 lb

## 2021-07-01 DIAGNOSIS — R059 Cough, unspecified: Secondary | ICD-10-CM | POA: Diagnosis not present

## 2021-07-01 DIAGNOSIS — J0100 Acute maxillary sinusitis, unspecified: Secondary | ICD-10-CM

## 2021-07-01 NOTE — Progress Notes (Signed)
Virtual Visit via Video Note  I connected with Haley Levine on 07/01/21 at 10:30 AM EDT by a video enabled telemedicine application and verified that I am speaking with the correct person using two identifiers.  Location patient: home Location provider: [place], [place] [address] Persons participating in the virtual visit: patient, provider  I discussed the limitations of evaluation and management by telemedicine and the availability of in person appointments. The patient expressed understanding and agreed to proceed.   Haley Levine DOB: 1991-02-26 Encounter date: 07/01/2021  This is a 30 y.o. female who presents with Chief Complaint  Patient presents with   Sore Throat    X4 days, states she was seen at an urgent care yesterday, strep test negative yesterday, Covid tests were negative   Sinus Problem    Patient complains of sinus congestion x1 day, taking a decongestant with some relief   Cough    Productive with brown sputum x1 day    History of present illness: Day 5 now of sore throat. Now with chest congestion. Using neti pot. Sinus headache. Not blowing out congestion from nose.   Just wanted to make sure she didn't need to do anything else. More light brown mucous.   Has 66 mo old daughter who has started day care. She personally doesn't have significant sinus infection history.   No fevers. Has been taking ibuprofen and tylenol pretty regularly as well as cold/flu otc. Those do seem to help a little.   Not short of breath. She was able to walk yesterday. She feels worse today. Just more tired, run down. Didn't sleep well last night.   She is not breast feeding.   No Known Allergies Current Meds  Medication Sig   Acetaminophen (TYLENOL PO) Take by mouth.   COVID-19 At Home Antigen Test (QUICKVUE AT-HOME COVID-19 TEST) KIT USE AS DIRECTED.   IBUPROFEN PO Take by mouth.   norgestimate-ethinyl estradiol  (ORTHO-CYCLEN) 0.25-35 MG-MCG tablet TAKE 1 TABLET BY MOUTH ONCE DAILY   sertraline (ZOLOFT) 50 MG tablet TAKE 1 TABLET (50 MG TOTAL) BY MOUTH AT BEDTIME.    Review of Systems  Constitutional:  Negative for chills and fever.  HENT:  Positive for congestion, postnasal drip, sinus pressure and sore throat (better today). Negative for ear pain, sinus pain and trouble swallowing.   Respiratory:  Positive for cough. Negative for chest tightness, shortness of breath and wheezing.   Cardiovascular:  Negative for chest pain.  Neurological:  Positive for headaches (sinus; better with otc medications).   Objective:  BP 120/70   Wt 188 lb (85.3 kg)   LMP 06/07/2021 (Approximate)   BMI 26.98 kg/m   Weight: 188 lb (85.3 kg)   BP Readings from Last 3 Encounters:  07/01/21 120/70  06/30/21 124/80  11/28/20 119/65   Wt Readings from Last 3 Encounters:  07/01/21 188 lb (85.3 kg)  11/26/20 204 lb 9.4 oz (92.8 kg)  04/25/20 188 lb (85.3 kg)    EXAM:  GENERAL: alert, oriented, appears well and in no acute distress  HEENT: atraumatic, conjunctiva clear, no obvious abnormalities on inspection of external nose and ears. She does sound congested.   NECK: normal movements of the head and neck  LUNGS: on inspection no signs of respiratory distress, breathing rate appears normal, no obvious gross SOB, gasping or wheezing. Occasional cough during visit.  CV: no obvious cyanosis  MS: moves all visible extremities without noticeable abnormality  PSYCH/NEURO: pleasant and cooperative, no obvious depression or anxiety, speech and thought processing grossly intact   Assessment/Plan 1. Cough Discussed typical viral illness course; let me know if any worsening of symptoms or if not feeling some improvement in next 48 hours with sinus; can consider abx treatment at that time.   2. Acute maxillary sinusitis, recurrence not specified See above. Ok to continue with cold/flu medication and  tylenol/ibuprofen as needed.     I discussed the assessment and treatment plan with the patient. The patient was provided an opportunity to ask questions and all were answered. The patient agreed with the plan and demonstrated an understanding of the instructions.   The patient was advised to call back or seek an in-person evaluation if the symptoms worsen or if the condition fails to improve as anticipated.  I provided 20 minutes of non-face-to-face time during this encounter.   Micheline Rough, MD

## 2021-07-02 LAB — CULTURE, GROUP A STREP (THRC)

## 2021-07-04 ENCOUNTER — Other Ambulatory Visit (HOSPITAL_COMMUNITY): Payer: Self-pay

## 2021-07-04 MED ORDER — AMOXICILLIN-POT CLAVULANATE 875-125 MG PO TABS
1.0000 | ORAL_TABLET | Freq: Two times a day (BID) | ORAL | 0 refills | Status: AC
  Filled 2021-07-04: qty 14, 7d supply, fill #0

## 2021-07-22 ENCOUNTER — Other Ambulatory Visit (HOSPITAL_COMMUNITY): Payer: Self-pay

## 2021-07-22 MED FILL — Sertraline HCl Tab 50 MG: ORAL | 90 days supply | Qty: 90 | Fill #2 | Status: AC

## 2021-09-24 ENCOUNTER — Encounter: Payer: Self-pay | Admitting: Family

## 2021-09-24 ENCOUNTER — Ambulatory Visit (INDEPENDENT_AMBULATORY_CARE_PROVIDER_SITE_OTHER): Payer: No Typology Code available for payment source | Admitting: Family

## 2021-09-24 ENCOUNTER — Other Ambulatory Visit: Payer: Self-pay

## 2021-09-24 VITALS — BP 111/76 | HR 95 | Temp 97.4°F | Ht 70.0 in | Wt 190.4 lb

## 2021-09-24 DIAGNOSIS — R6889 Other general symptoms and signs: Secondary | ICD-10-CM | POA: Insufficient documentation

## 2021-09-24 DIAGNOSIS — J029 Acute pharyngitis, unspecified: Secondary | ICD-10-CM | POA: Insufficient documentation

## 2021-09-24 LAB — POC COVID19 BINAXNOW: SARS Coronavirus 2 Ag: NEGATIVE

## 2021-09-24 LAB — POCT RAPID STREP A (OFFICE): Rapid Strep A Screen: NEGATIVE

## 2021-09-24 LAB — POCT INFLUENZA A/B
Influenza A, POC: NEGATIVE
Influenza B, POC: NEGATIVE

## 2021-09-24 NOTE — Assessment & Plan Note (Signed)
Covid, flu and strep neg. 101 fever with sore throat, husb and baby positive for flu 2 weeks ago. Continue OTC sinus/cold medications including generic Sudafed, Claritin D, or Mucinex per box directions, Ibuprofen up to 600mg  or generic Tylenol 1,000mg  every 6 hours. OK to take Zinc up to 25mg  & up to 2,000mg  Vitamin C daily while having symptoms, then reduce doses to 3-4 days per week, or 1/2 dose daily to boost immunity.  Increase water intake to at least 2 liters daily.

## 2021-09-24 NOTE — Progress Notes (Signed)
Subjective:     Patient ID: Haley Levine, female    DOB: 1990-10-19, 30 y.o.   MRN: [id number]  Chief Complaint  Patient presents with   Flu like Symptoms     HPI: Upper Respiratory Infection: Symptoms include bilateral ear pressure/pain, fever 101, and sore throat.  Onset of symptoms was 1 day ago, gradually worsening since that time. She is drinking moderate amounts of fluids. Evaluation to date: none.  Treatment to date: none.    Health Maintenance Due  Topic Date Due   Hepatitis C Screening  Never done   PAP SMEAR-Modifier  Never done   INFLUENZA VACCINE  05/13/2021    Past Medical History:  Diagnosis Date   Anxiety    GERD (gastroesophageal reflux disease)     Past Surgical History:  Procedure Laterality Date   BREAST REDUCTION SURGERY Bilateral 2012   WISDOM TOOTH EXTRACTION      Outpatient Medications Prior to Visit  Medication Sig Dispense Refill   Acetaminophen (TYLENOL PO) Take by mouth.     IBUPROFEN PO Take by mouth.     norgestimate-ethinyl estradiol (ORTHO-CYCLEN) 0.25-35 MG-MCG tablet TAKE 1 TABLET BY MOUTH ONCE DAILY 84 tablet 3   sertraline (ZOLOFT) 50 MG tablet TAKE 1 TABLET (50 MG TOTAL) BY MOUTH AT BEDTIME. 90 tablet 3   COVID-19 At Home Antigen Test (QUICKVUE AT-HOME COVID-19 TEST) KIT USE AS DIRECTED. (Patient not taking: Reported on 09/24/2021) 4 each 0   No facility-administered medications prior to visit.    No Known Allergies      Objective:    Physical Exam Vitals and nursing note reviewed.  Constitutional:      Appearance: Normal appearance.  HENT:     Right Ear: Tympanic membrane and ear canal normal.     Left Ear: Ear canal normal. Left ear tenderness: mild. A middle ear effusion is present.     Mouth/Throat:     Mouth: Mucous membranes are moist.     Pharynx: Posterior oropharyngeal erythema present. No pharyngeal swelling or oropharyngeal exudate.     Tonsils: No tonsillar exudate or tonsillar abscesses.   Cardiovascular:     Rate and Rhythm: Normal rate and regular rhythm.  Pulmonary:     Effort: Pulmonary effort is normal.     Breath sounds: Normal breath sounds.  Musculoskeletal:        General: Normal range of motion.  Skin:    General: Skin is warm and dry.  Neurological:     Mental Status: She is alert.  Psychiatric:        Mood and Affect: Mood normal.        Behavior: Behavior normal.    BP 111/76    Pulse 95    Temp (!) 97.4 F (36.3 C) (Temporal)    Ht _0  (1.778 m)    Wt 190 lb 6.4 oz (86.4 kg)    SpO2 100%    BMI 27.32 kg/m  Wt Readings from Last 3 Encounters:  09/24/21 190 lb 6.4 oz (86.4 kg)  07/01/21 188 lb (85.3 kg)  11/26/20 204 lb 9.4 oz (92.8 kg)       Assessment & Plan:   Problem List Items Addressed This Visit       Other   Sore throat - Primary   Relevant Orders   POC COVID-19 (Completed)   POCT rapid strep A (Completed)   Flu-like symptoms    Covid, flu and strep neg. 101 fever with sore throat,  husb and baby positive for flu 2 weeks ago. Continue OTC sinus/cold medications including generic Sudafed, Claritin D, or Mucinex per box directions, Ibuprofen up to 642m or generic Tylenol 1,0057mevery 6 hours. OK to take Zinc up to 2573m up to 2,000m28mtamin C daily while having symptoms, then reduce doses to 3-4 days per week, or 1/2 dose daily to boost immunity.  Increase water intake to at least 2 liters daily.        Relevant Orders   POCT Influenza A/B (Completed)

## 2021-09-24 NOTE — Patient Instructions (Signed)

## 2021-10-15 ENCOUNTER — Other Ambulatory Visit (HOSPITAL_COMMUNITY): Payer: Self-pay

## 2021-10-15 MED FILL — Norgestimate & Ethinyl Estradiol Tab 0.25 MG-35 MCG: ORAL | 84 days supply | Qty: 84 | Fill #2 | Status: AC

## 2021-10-15 MED FILL — Sertraline HCl Tab 50 MG: ORAL | 90 days supply | Qty: 90 | Fill #3 | Status: AC

## 2021-10-23 ENCOUNTER — Encounter: Payer: No Typology Code available for payment source | Admitting: Physician Assistant

## 2021-10-28 ENCOUNTER — Other Ambulatory Visit (HOSPITAL_COMMUNITY): Payer: Self-pay

## 2021-10-28 ENCOUNTER — Other Ambulatory Visit: Payer: Self-pay

## 2021-10-28 ENCOUNTER — Ambulatory Visit (INDEPENDENT_AMBULATORY_CARE_PROVIDER_SITE_OTHER): Payer: No Typology Code available for payment source | Admitting: Physician Assistant

## 2021-10-28 ENCOUNTER — Encounter: Payer: Self-pay | Admitting: Physician Assistant

## 2021-10-28 VITALS — BP 102/70 | HR 65 | Temp 97.5°F | Ht 70.0 in | Wt 191.5 lb

## 2021-10-28 DIAGNOSIS — F411 Generalized anxiety disorder: Secondary | ICD-10-CM

## 2021-10-28 DIAGNOSIS — Z1159 Encounter for screening for other viral diseases: Secondary | ICD-10-CM | POA: Diagnosis not present

## 2021-10-28 DIAGNOSIS — Z0000 Encounter for general adult medical examination without abnormal findings: Secondary | ICD-10-CM | POA: Diagnosis not present

## 2021-10-28 DIAGNOSIS — E663 Overweight: Secondary | ICD-10-CM | POA: Diagnosis not present

## 2021-10-28 DIAGNOSIS — Z13220 Encounter for screening for lipoid disorders: Secondary | ICD-10-CM | POA: Diagnosis not present

## 2021-10-28 DIAGNOSIS — Z136 Encounter for screening for cardiovascular disorders: Secondary | ICD-10-CM

## 2021-10-28 LAB — COMPREHENSIVE METABOLIC PANEL
ALT: 14 U/L (ref 0–35)
AST: 15 U/L (ref 0–37)
Albumin: 4.7 g/dL (ref 3.5–5.2)
Alkaline Phosphatase: 55 U/L (ref 39–117)
BUN: 14 mg/dL (ref 6–23)
CO2: 26 mEq/L (ref 19–32)
Calcium: 9.8 mg/dL (ref 8.4–10.5)
Chloride: 104 mEq/L (ref 96–112)
Creatinine, Ser: 0.79 mg/dL (ref 0.40–1.20)
GFR: 100.56 mL/min (ref >60.00)
Glucose, Bld: 87 mg/dL (ref 70–99)
Potassium: 4.7 mEq/L (ref 3.5–5.1)
Sodium: 138 mEq/L (ref 135–145)
Total Bilirubin: 0.5 mg/dL (ref 0.2–1.2)
Total Protein: 7.3 g/dL (ref 6.0–8.3)

## 2021-10-28 LAB — CBC WITH DIFFERENTIAL/PLATELET
Basophils Absolute: 0 10*3/uL (ref 0.0–0.1)
Basophils Relative: 0.6 % (ref 0.0–3.0)
Eosinophils Absolute: 0.1 10*3/uL (ref 0.0–0.7)
Eosinophils Relative: 0.8 % (ref 0.0–5.0)
HCT: 40.2 % (ref 36.0–46.0)
Hemoglobin: 13.2 g/dL (ref 12.0–15.0)
Lymphocytes Relative: 37.3 % (ref 12.0–46.0)
Lymphs Abs: 2.8 10*3/uL (ref 0.7–4.0)
MCHC: 32.9 g/dL (ref 30.0–36.0)
MCV: 91.7 fl (ref 78.0–100.0)
Monocytes Absolute: 0.5 10*3/uL (ref 0.1–1.0)
Monocytes Relative: 6 % (ref 3.0–12.0)
Neutro Abs: 4.2 10*3/uL (ref 1.4–7.7)
Neutrophils Relative %: 55.3 % (ref 43.0–77.0)
Platelets: 170 10*3/uL (ref 150.0–400.0)
RBC: 4.38 Mil/uL (ref 3.87–5.11)
RDW: 13.2 % (ref 11.5–15.5)
WBC: 7.5 10*3/uL (ref 4.0–10.5)

## 2021-10-28 LAB — LIPID PANEL
Cholesterol: 171 mg/dL (ref 0–200)
HDL: 52 mg/dL (ref >39.00)
LDL Cholesterol: 98 mg/dL (ref 0–99)
NonHDL: 118.96
Total CHOL/HDL Ratio: 3
Triglycerides: 105 mg/dL (ref 0.0–149.0)
VLDL: 21 mg/dL (ref 0.0–40.0)

## 2021-10-28 MED ORDER — SERTRALINE HCL 50 MG PO TABS
50.0000 mg | ORAL_TABLET | Freq: Every day | ORAL | 3 refills | Status: DC
Start: 2021-10-28 — End: 2022-11-24
  Filled 2021-10-28: qty 90, fill #0
  Filled 2022-01-28: qty 90, 90d supply, fill #0
  Filled 2022-05-12: qty 90, 90d supply, fill #1
  Filled 2022-08-06: qty 90, 90d supply, fill #2

## 2021-10-28 NOTE — Patient Instructions (Addendum)
It was great to see you! ? ?Please go to the lab for blood work.  ? ?Our office will call you with your results unless you have chosen to receive results via MyChart. ? ?If your blood work is normal we will follow-up each year for physicals and as scheduled for chronic medical problems. ? ?If anything is abnormal we will treat accordingly and get you in for a follow-up. ? ?Take care, ? ?Edna Grover ?  ? ? ?

## 2021-10-28 NOTE — Progress Notes (Signed)
Subjective:    Haley Levine is a 31 y.o. female and is here for a comprehensive physical exam.  HPI  Health Maintenance Due  Topic Date Due   Hepatitis C Screening  Never done    Acute Concerns: None discussed at this time.  Chronic Issues: Anxiety/Depression Haley Levine is compliant with taking zoloft 50 mg daily with no adverse effects. She has found the medication to be beneficial and is managing well.   Health Maintenance: Immunizations -- Covid- Due Influenza-  UTD; 2022 Tdap- UTD;2021 PAP -- UTD per patient report Bone Density -- N/A Diet -- Eats all food groups Dentistry- UTD Ophthalmology- Will update soon Sleep habits -- Normal schedule Exercise -- Walks daily Current Weight -- Stable Weight History: Wt Readings from Last 10 Encounters:  10/28/21 191 lb 8 oz (86.9 kg)  09/24/21 190 lb 6.4 oz (86.4 kg)  07/01/21 188 lb (85.3 kg)  11/26/20 204 lb 9.4 oz (92.8 kg)  04/25/20 188 lb (85.3 kg)  12/14/19 195 lb 4 oz (88.6 kg)   Body mass index is 27.48 kg/m. Mood -- Stable  Patient's last menstrual period was 10/27/2021 (exact date). Period characteristics -- Normal Birth control -- Ortho-cyclen 35 mg daily     reports current alcohol use.  Tobacco Use: Low Risk    Smoking Tobacco Use: Never   Smokeless Tobacco Use: Never   Passive Exposure: Not on file     Depression screen PHQ 2/9 10/28/2021  Decreased Interest 0  Down, Depressed, Hopeless 0  PHQ - 2 Score 0     Other providers/specialists: Patient Care Team: Jarold Motto, [place] as PCP - General (Physician Assistant)   PMHx, SurgHx, SocialHx, Medications, and Allergies were reviewed in the Visit Navigator and updated as appropriate.   Past Medical History:  Diagnosis Date   Anxiety    GERD (gastroesophageal reflux disease)      Past Surgical History:  Procedure Laterality Date   BREAST REDUCTION SURGERY Bilateral 2012   COSMETIC SURGERY  2012   WISDOM TOOTH EXTRACTION        Family History  Problem Relation Age of Onset   Osteoarthritis Mother    Hypertension Mother    Anxiety disorder Mother    Cancer Father    Osteoarthritis Father    Hyperlipidemia Father    Cancer Maternal Grandfather        pancreatic   Cancer Paternal Grandmother        colon   Cancer Paternal Grandfather        breast, lung    Social History   Tobacco Use   Smoking status: Never   Smokeless tobacco: Never  Vaping Use   Vaping Use: Never used  Substance Use Topics   Alcohol use: Yes    Comment: Occasionally   Drug use: Never    Review of Systems:   Review of Systems  Constitutional:  Negative for chills, fever, malaise/fatigue and weight loss.  HENT:  Negative for hearing loss, sinus pain and sore throat.   Respiratory:  Negative for cough and hemoptysis.   Cardiovascular:  Negative for chest pain, palpitations, leg swelling and PND.  Gastrointestinal:  Negative for abdominal pain, constipation, diarrhea, heartburn, nausea and vomiting.  Genitourinary:  Negative for dysuria, frequency and urgency.  Musculoskeletal:  Negative for back pain, myalgias and neck pain.  Skin:  Negative for itching and rash.  Neurological:  Negative for dizziness, tingling, seizures and headaches.  Endo/Heme/Allergies:  Negative for polydipsia.  Psychiatric/Behavioral:  Negative for depression. The patient is not nervous/anxious.    Objective:   BP 102/70 (BP Location: Left Arm, Patient Position: Sitting, Cuff Size: Large)    Pulse 65    Temp (!) 97.5 F (36.4 C) (Temporal)    Ht 5\' 10"  (1.778 m)    Wt 191 lb 8 oz (86.9 kg)    LMP 10/27/2021 (Exact Date)    SpO2 95%    Breastfeeding No    BMI 27.48 kg/m   General Appearance:    Alert, cooperative, no distress, appears stated age  Head:    Normocephalic, without obvious abnormality, atraumatic  Eyes:    PERRL, conjunctiva/corneas clear, EOM's intact, fundi    benign, both eyes  Ears:    Normal TM's and external ear canals, both  ears  Nose:   Nares normal, septum midline, mucosa normal, no drainage    or sinus tenderness  Throat:   Lips, mucosa, and tongue normal; teeth and gums normal  Neck:   Supple, symmetrical, trachea midline, no adenopathy;    thyroid:  no enlargement/tenderness/nodules; no carotid   bruit or JVD  Back:     Symmetric, no curvature, ROM normal, no CVA tenderness  Lungs:     Clear to auscultation bilaterally, respirations unlabored  Chest Wall:    No tenderness or deformity   Heart:    Regular rate and rhythm, S1 and S2 normal, no murmur, rub   or gallop  Breast Exam:    Deferred  Abdomen:     Soft, non-tender, bowel sounds active all four quadrants,    no masses, no organomegaly  Genitalia:   Deferred  Rectal:   Deferred   Extremities:   Extremities normal, atraumatic, no cyanosis or edema  Pulses:   2+ and symmetric all extremities  Skin:   Skin color, texture, turgor normal, no rashes or lesions  Lymph nodes:   Cervical, supraclavicular, and axillary nodes normal  Neurologic:   CNII-XII intact, normal strength, sensation and reflexes    throughout    Assessment/Plan:  Routine Physical Examination Today patient counseled on age appropriate routine health concerns for screening and prevention, each reviewed and up to date or declined. Immunizations reviewed and up to date or declined. Labs ordered and reviewed. Risk factors for depression reviewed and negative. Hearing function and visual acuity are intact. ADLs screened and addressed as needed. Functional ability and level of safety reviewed and appropriate. Education, counseling and referrals performed based on assessed risks today. Patient provided with a copy of personalized plan for preventive services.  Generalized anxiety disorder Stable Continue Zoloft 50 mg daily  Refill Zoloft 50 mg daily x 12 months I advised patient that if they develop any SI, to tell someone immediately and seek medical attention Follow up as  needed    Patient Counseling:   [x]     Nutrition: Stressed importance of moderation in sodium/caffeine intake, saturated fat and cholesterol, caloric balance, sufficient intake of fresh fruits, vegetables, fiber, calcium, iron, and 1 mg of folate supplement per day (for females capable of pregnancy).   [x]      Stressed the importance of regular exercise.    [x]     Substance Abuse: Discussed cessation/primary prevention of tobacco, alcohol, or other drug use; driving or other dangerous activities under the influence; availability of treatment for abuse.    [x]      Injury prevention: Discussed safety belts, safety helmets, smoke detector, smoking near bedding or upholstery.    [x]   Sexuality: Discussed sexually transmitted diseases, partner selection, use of condoms, avoidance of unintended pregnancy  and contraceptive alternatives.    [x]     Dental health: Discussed importance of regular tooth brushing, flossing, and dental visits.   [x]      Health maintenance and immunizations reviewed. Please refer to Health maintenance section.   I,Havlyn C Ratchford,acting as a Neurosurgeonscribe for Energy East CorporationSamantha Jacqeline Broers, PA.,have documented all relevant documentation on the behalf of Jarold MottoSamantha Shin Lamour, PA,as directed by  Jarold MottoSamantha Kaileigh Viswanathan, PA while in the presence of Jarold MottoSamantha Deanta Mincey, [place].  I, Jarold MottoSamantha Doreena Maulden, [place], have reviewed all documentation for this visit. The documentation on 10/28/21 for the exam, diagnosis, procedures, and orders are all accurate and complete.   Jarold MottoSamantha Dinesha Twiggs, PA-C [facility]

## 2021-10-29 LAB — HEPATITIS C ANTIBODY
Hepatitis C Ab: NONREACTIVE
SIGNAL TO CUT-OFF: 0.08 (ref <1.00)

## 2021-11-21 ENCOUNTER — Encounter: Payer: Self-pay | Admitting: Physician Assistant

## 2022-01-13 ENCOUNTER — Other Ambulatory Visit (HOSPITAL_COMMUNITY): Payer: Self-pay

## 2022-01-13 ENCOUNTER — Other Ambulatory Visit: Payer: Self-pay

## 2022-01-14 ENCOUNTER — Other Ambulatory Visit (HOSPITAL_COMMUNITY): Payer: Self-pay

## 2022-01-14 MED ORDER — NORGESTIMATE-ETH ESTRADIOL 0.25-35 MG-MCG PO TABS
1.0000 | ORAL_TABLET | Freq: Every day | ORAL | 0 refills | Status: DC
  Filled 2022-01-14: qty 84, 84d supply, fill #0

## 2022-01-28 ENCOUNTER — Other Ambulatory Visit (HOSPITAL_COMMUNITY): Payer: Self-pay

## 2022-03-11 ENCOUNTER — Other Ambulatory Visit (HOSPITAL_COMMUNITY): Payer: Self-pay

## 2022-03-11 MED ORDER — NORGESTIMATE-ETH ESTRADIOL 0.25-35 MG-MCG PO TABS
1.0000 | ORAL_TABLET | Freq: Every day | ORAL | 3 refills | Status: DC
  Filled 2022-03-11 – 2022-06-06 (×2): qty 84, 84d supply, fill #0
  Filled 2022-08-06: qty 84, 84d supply, fill #1
  Filled 2022-11-24: qty 84, 84d supply, fill #2
  Filled 2023-01-29: qty 84, 84d supply, fill #3

## 2022-05-13 ENCOUNTER — Other Ambulatory Visit (HOSPITAL_COMMUNITY): Payer: Self-pay

## 2022-06-06 ENCOUNTER — Other Ambulatory Visit (HOSPITAL_COMMUNITY): Payer: Self-pay

## 2022-07-07 ENCOUNTER — Encounter: Payer: Self-pay | Admitting: *Deleted

## 2022-08-06 ENCOUNTER — Other Ambulatory Visit (HOSPITAL_COMMUNITY): Payer: Self-pay

## 2022-08-12 ENCOUNTER — Other Ambulatory Visit (HOSPITAL_COMMUNITY): Payer: Self-pay

## 2022-09-25 ENCOUNTER — Encounter: Payer: Self-pay | Admitting: *Deleted

## 2022-11-24 ENCOUNTER — Other Ambulatory Visit: Payer: Self-pay

## 2022-11-24 ENCOUNTER — Other Ambulatory Visit: Payer: Self-pay | Admitting: Physician Assistant

## 2022-11-25 ENCOUNTER — Other Ambulatory Visit (HOSPITAL_COMMUNITY): Payer: Self-pay

## 2022-11-25 MED ORDER — SERTRALINE HCL 50 MG PO TABS
50.0000 mg | ORAL_TABLET | Freq: Every day | ORAL | 0 refills | Status: DC
  Filled 2022-11-25: qty 30, 30d supply, fill #0

## 2022-12-03 NOTE — Progress Notes (Signed)
Subjective:    Haley Levine is a 32 y.o. female and is here for a comprehensive physical exam.  HPI  Health Maintenance Due  Topic Date Due   COVID-19 Vaccine (1) Never done    Acute Concerns: None  Chronic Issues: Anxiety Treated with Zoloft 50 mg daily at bedtime. She is happy with this dosage and denies any major concerns. Denies SI/HI  Health Maintenance: Immunizations -- Due for flu, Covid-19 vaccines. UTD on tetanus vaccine. Colonoscopy -- N/A Mammogram -- N/A PAP -- Last completed on 05/10/20. Negative for intraepithelial lesions or malignancy. Recommended repeat in 2024. Bone Density -- N/A Diet -- Could be better. Exercise -- Walking outside and on indoor treadmill.  Sleep habits -- Good. Mood -- Stable.  UTD with dentist? - UTD UTD with eye doctor? - Not UTD. Noticing blurry distance vision.  Weight history: Wt Readings from Last 10 Encounters:  12/10/22 200 lb 6.4 oz (90.9 kg)  10/28/21 191 lb 8 oz (86.9 kg)  09/24/21 190 lb 6.4 oz (86.4 kg)  07/01/21 188 lb (85.3 kg)  11/26/20 204 lb 9.4 oz (92.8 kg)  04/25/20 188 lb (85.3 kg)  12/14/19 195 lb 4 oz (88.6 kg)   Body mass index is 28.75 kg/m. Patient's last menstrual period was 11/21/2022 (exact date).  Alcohol use:  reports current alcohol use.  Tobacco use:  Tobacco Use: Low Risk  (12/10/2022)   Patient History    Smoking Tobacco Use: Never    Smokeless Tobacco Use: Never    Passive Exposure: Not on file   Eligible for lung cancer screening? No     12/10/2022    2:18 PM  Depression screen PHQ 2/9  Decreased Interest 0  Down, Depressed, Hopeless 0  PHQ - 2 Score 0  Altered sleeping 0  Tired, decreased energy 0  Change in appetite 1  Feeling bad or failure about yourself  0  Trouble concentrating 0  Moving slowly or fidgety/restless 0  Suicidal thoughts 0  PHQ-9 Score 1  Difficult doing work/chores Not difficult at all     Other providers/specialists: Patient Care  Team: Inda Coke, [place] as PCP - General (Physician Assistant)    PMHx, SurgHx, SocialHx, Medications, and Allergies were reviewed in the Visit Navigator and updated as appropriate.   Past Medical History:  Diagnosis Date   Anxiety    GERD (gastroesophageal reflux disease)      Past Surgical History:  Procedure Laterality Date   BREAST REDUCTION SURGERY Bilateral 2012   COSMETIC SURGERY  2012   WISDOM TOOTH EXTRACTION       Family History  Problem Relation Age of Onset   Osteoarthritis Mother    Hypertension Mother    Anxiety disorder Mother    Cancer Father        rare brain tumor   Osteoarthritis Father    Hyperlipidemia Father    Cancer Maternal Grandfather        pancreatic   Cancer Paternal Grandmother        colon   Breast cancer Paternal Grandmother 101   Cancer Paternal Grandfather        lung    Social History   Tobacco Use   Smoking status: Never   Smokeless tobacco: Never  Vaping Use   Vaping Use: Never used  Substance Use Topics   Alcohol use: Yes    Comment: Occasionally   Drug use: Never    Review of Systems:   Review of Systems  Constitutional:  Negative for chills, fever, malaise/fatigue and weight loss.  HENT:  Negative for congestion, hearing loss, sinus pain and sore throat.   Eyes:  Negative for blurred vision.  Respiratory:  Negative for cough, hemoptysis and shortness of breath.   Cardiovascular:  Negative for chest pain, palpitations, leg swelling and PND.  Gastrointestinal:  Negative for abdominal pain, blood in stool, constipation, diarrhea, heartburn, nausea and vomiting.  Genitourinary:  Negative for dysuria, frequency and urgency.  Musculoskeletal:  Negative for back pain, myalgias and neck pain.  Skin:  Negative for itching and rash.       (+) New mole right palm  Neurological:  Negative for dizziness, tingling, seizures, loss of consciousness and headaches.  Endo/Heme/Allergies:  Negative for polydipsia.   Psychiatric/Behavioral:  Negative for depression. The patient is not nervous/anxious.     Objective:   BP 110/71 (BP Location: Left Arm, Patient Position: Sitting)   Pulse 71   Temp 98.2 F (36.8 C) (Temporal)   Ht '5\' 10"'$  (1.778 m)   Wt 200 lb 6.4 oz (90.9 kg)   LMP 11/21/2022 (Exact Date)   SpO2 98%   BMI 28.75 kg/m  Body mass index is 28.75 kg/m.   General Appearance:    Alert, cooperative, no distress, appears stated age  Head:    Normocephalic, without obvious abnormality, atraumatic  Eyes:    PERRL, conjunctiva/corneas clear, EOM's intact, fundi    benign, both eyes  Ears:    Normal TM's and external ear canals, both ears  Nose:   Nares normal, septum midline, mucosa normal, no drainage    or sinus tenderness  Throat:   Lips, mucosa, and tongue normal; teeth and gums normal  Neck:   Supple, symmetrical, trachea midline, no adenopathy;    thyroid:  no enlargement/tenderness/nodules; no carotid   bruit or JVD  Back:     Symmetric, no curvature, ROM normal, no CVA tenderness  Lungs:     Clear to auscultation bilaterally, respirations unlabored  Chest Wall:    No tenderness or deformity   Heart:    Regular rate and rhythm, S1 and S2 normal, no murmur, rub or gallop  Breast Exam:    Deferred   Abdomen:     Soft, non-tender, bowel sounds active all four quadrants,    no masses, no organomegaly  Genitalia:    Deferred   Extremities:   Extremities normal, atraumatic, no cyanosis or edema  Pulses:   2+ and symmetric all extremities  Skin:   Skin color, texture, turgor normal, no rashes or lesions  Lymph nodes:   Cervical, supraclavicular, and axillary nodes normal  Neurologic:   CNII-XII intact, normal strength, sensation and reflexes    throughout    Assessment/Plan:   Routine physical examination Today patient counseled on age appropriate routine health concerns for screening and prevention, each reviewed and up to date or declined. Immunizations reviewed and up to  date or declined. Labs deferred today. Risk factors for depression reviewed and negative. Hearing function and visual acuity are intact. ADLs screened and addressed as needed. Functional ability and level of safety reviewed and appropriate. Education, counseling and referrals performed based on assessed risks today. Patient provided with a copy of personalized plan for preventive services.  Generalized anxiety disorder Well controlled Continue zoloft 50 mg daily Follow-up in 1 year, sooner if concerns  Encounter for surveillance of abnormal nevi Referral to dermatology    I,Alexander Ruley,acting as a scribe for [employer], PA.,have documented all  relevant documentation on the behalf of Inda Coke, PA,as directed by  Inda Coke, PA while in the presence of Inda Coke, [place].   I, Inda Coke, [place], have reviewed all documentation for this visit. The documentation on 12/10/22 for the exam, diagnosis, procedures, and orders are all accurate and complete.    Inda Coke, PA-C [place]

## 2022-12-10 ENCOUNTER — Encounter: Payer: Self-pay | Admitting: Physician Assistant

## 2022-12-10 ENCOUNTER — Ambulatory Visit (INDEPENDENT_AMBULATORY_CARE_PROVIDER_SITE_OTHER): Payer: 59 | Admitting: Physician Assistant

## 2022-12-10 VITALS — BP 110/71 | HR 71 | Temp 98.2°F | Ht 70.0 in | Wt 200.4 lb

## 2022-12-10 DIAGNOSIS — Z1389 Encounter for screening for other disorder: Secondary | ICD-10-CM

## 2022-12-10 DIAGNOSIS — F411 Generalized anxiety disorder: Secondary | ICD-10-CM

## 2022-12-10 DIAGNOSIS — Z0000 Encounter for general adult medical examination without abnormal findings: Secondary | ICD-10-CM

## 2022-12-10 DIAGNOSIS — Q369 Cleft lip, unilateral: Secondary | ICD-10-CM | POA: Insufficient documentation

## 2022-12-10 NOTE — Patient Instructions (Addendum)
It was great to see you!  Keep up the good work!  See you in one year.  Take care,  Aldona Bar

## 2023-01-12 ENCOUNTER — Other Ambulatory Visit: Payer: Self-pay | Admitting: Physician Assistant

## 2023-01-13 ENCOUNTER — Other Ambulatory Visit (HOSPITAL_COMMUNITY): Payer: Self-pay

## 2023-01-16 ENCOUNTER — Other Ambulatory Visit (HOSPITAL_COMMUNITY): Payer: Self-pay

## 2023-01-16 ENCOUNTER — Other Ambulatory Visit: Payer: Self-pay | Admitting: Physician Assistant

## 2023-01-16 MED ORDER — SERTRALINE HCL 50 MG PO TABS
50.0000 mg | ORAL_TABLET | Freq: Every day | ORAL | 5 refills | Status: DC
  Filled 2023-01-16: qty 30, 30d supply, fill #0
  Filled 2023-02-13: qty 30, 30d supply, fill #1
  Filled 2023-03-26: qty 30, 30d supply, fill #2
  Filled 2023-04-30: qty 30, 30d supply, fill #3
  Filled 2023-06-08: qty 30, 30d supply, fill #4
  Filled 2023-07-11: qty 30, 30d supply, fill #5

## 2023-01-20 ENCOUNTER — Encounter: Payer: Self-pay | Admitting: Dermatology

## 2023-01-20 ENCOUNTER — Ambulatory Visit: Payer: 59 | Admitting: Dermatology

## 2023-01-20 DIAGNOSIS — W908XXA Exposure to other nonionizing radiation, initial encounter: Secondary | ICD-10-CM

## 2023-01-20 DIAGNOSIS — Z1283 Encounter for screening for malignant neoplasm of skin: Secondary | ICD-10-CM | POA: Diagnosis not present

## 2023-01-20 DIAGNOSIS — X32XXXA Exposure to sunlight, initial encounter: Secondary | ICD-10-CM

## 2023-01-20 DIAGNOSIS — D225 Melanocytic nevi of trunk: Secondary | ICD-10-CM

## 2023-01-20 DIAGNOSIS — D1801 Hemangioma of skin and subcutaneous tissue: Secondary | ICD-10-CM

## 2023-01-20 DIAGNOSIS — L821 Other seborrheic keratosis: Secondary | ICD-10-CM | POA: Diagnosis not present

## 2023-01-20 DIAGNOSIS — L814 Other melanin hyperpigmentation: Secondary | ICD-10-CM

## 2023-01-20 DIAGNOSIS — L578 Other skin changes due to chronic exposure to nonionizing radiation: Secondary | ICD-10-CM

## 2023-01-20 NOTE — Progress Notes (Unsigned)
   New Patient Visit   Subjective  Haley Levine is a 32 y.o. female who presents for the following: Skin Cancer Screening and Full Body Skin Exam  Last dermatology appointment was about five years ago. In the last few months there is a nevus that has presented on the right palm. Paternal and Maternal hx of BCC, no personal hx of skin cancer or abnormal nevi.   The patient presents for Total-Body Skin Exam (TBSE) for skin cancer screening and mole check. The patient has spots, moles and lesions to be evaluated, some may be new or changing and the patient has concerns that these could be cancer.    The following portions of the chart were reviewed this encounter and updated as appropriate: medications, allergies, medical history  Review of Systems:  No other skin or systemic complaints except as noted in HPI or Assessment and Plan.  Objective  Well appearing patient in no apparent distress; mood and affect are within normal limits.  A full examination was performed including scalp, head, eyes, ears, nose, lips, neck, chest, axillae, abdomen, back, buttocks, bilateral upper extremities, bilateral lower extremities, hands, feet, fingers, toes, fingernails, and toenails. All findings within normal limits unless otherwise noted below.   Relevant physical exam findings are noted in the Assessment and Plan.    Assessment & Plan   LENTIGINES, SEBORRHEIC KERATOSES, HEMANGIOMAS - Benign normal skin lesions - Benign-appearing - Call for any changes  MELANOCYTIC NEVI - Tan-brown and/or pink-flesh-colored symmetric macules and papules - Benign appearing on exam today - Observation - Call clinic for new or changing moles - Recommend daily use of broad spectrum spf 30+ sunscreen to sun-exposed areas.   ACTINIC DAMAGE - Chronic condition, secondary to cumulative UV/sun exposure - diffuse scaly erythematous macules with underlying dyspigmentation - Recommend daily broad spectrum  sunscreen SPF 30+ to sun-exposed areas, reapply every 2 hours as needed.  - Staying in the shade or wearing long sleeves, sun glasses (UVA+UVB protection) and wide brim hats (4-inch brim around the entire circumference of the hat) are also recommended for sun protection.  - Call for new or changing lesions.  SKIN CANCER SCREENING PERFORMED TODAY.      Return in about 1 year (around 01/20/2024) for Annual skin check.    Documentation: I have reviewed the above documentation for accuracy and completeness, and I agree with the above.  Langston Reusing, MD  I, Germaine Pomfret, CMA, am acting as scribe for Langston Reusing, MD.

## 2023-01-20 NOTE — Patient Instructions (Addendum)
Due to recent changes in healthcare laws, you may see results of your pathology and/or laboratory studies on MyChart before the doctors have had a chance to review them. We understand that in some cases there may be results that are confusing or concerning to you. Please understand that not all results are received at the same time and often the doctors may need to interpret multiple results in order to provide you with the best plan of care or course of treatment. Therefore, we ask that you please give us 2 business days to thoroughly review all your results before contacting the office for clarification. Should we see a critical lab result, you will be contacted sooner.   If You Need Anything After Your Visit  If you have any questions or concerns for your doctor, please call our main line at [phone] If no one answers, please leave a voicemail as directed and we will return your call as soon as possible. Messages left after 4 pm will be answered the following business day.   You may also send us a message via MyChart. We typically respond to MyChart messages within 1-2 business days.  For prescription refills, please ask your pharmacy to contact our office. Our fax number is [phone].  If you have an urgent issue when the clinic is closed that cannot wait until the next business day, you can page your doctor at the number below.    Please note that while we do our best to be available for urgent issues outside of office hours, we are not available 24/7.   If you have an urgent issue and are unable to reach us, you may choose to seek medical care at your doctor's office, retail clinic, urgent care center, or emergency room.  If you have a medical emergency, please immediately call 911 or go to the emergency department. In the event of inclement weather, please call our main line at [phone] for an update on the status of any delays or closures.  Dermatology Medication Tips: Please  keep the boxes that topical medications come in in order to help keep track of the instructions about where and how to use these. Pharmacies typically print the medication instructions only on the boxes and not directly on the medication tubes.   If your medication is too expensive, please contact our office at [phone] or send us a message through MyChart.   We are unable to tell what your co-pay for medications will be in advance as this is different depending on your insurance coverage. However, we may be able to find a substitute medication at lower cost or fill out paperwork to get insurance to cover a needed medication.   If a prior authorization is required to get your medication covered by your insurance company, please allow us 1-2 business days to complete this process.  Drug prices often vary depending on where the prescription is filled and some pharmacies may offer cheaper prices.  The website www.goodrx.com contains coupons for medications through different pharmacies. The prices here do not account for what the cost may be with help from insurance (it may be cheaper with your insurance), but the website can give you the price if you did not use any insurance.  - You can print the associated coupon and take it with your prescription to the pharmacy.  - You may also stop by our office during regular business hours and pick up a GoodRx coupon card.  - If you need your   prescription sent electronically to a different pharmacy, notify our office through [facility] MyChart or by phone at [phone]     

## 2023-01-21 ENCOUNTER — Encounter: Payer: Self-pay | Admitting: Dermatology

## 2023-01-29 ENCOUNTER — Other Ambulatory Visit (HOSPITAL_COMMUNITY): Payer: Self-pay

## 2023-06-02 ENCOUNTER — Other Ambulatory Visit (HOSPITAL_COMMUNITY): Payer: Self-pay

## 2023-06-02 DIAGNOSIS — Z6827 Body mass index (BMI) 27.0-27.9, adult: Secondary | ICD-10-CM | POA: Diagnosis not present

## 2023-06-02 DIAGNOSIS — Z01419 Encounter for gynecological examination (general) (routine) without abnormal findings: Secondary | ICD-10-CM | POA: Diagnosis not present

## 2023-06-02 MED ORDER — NORGESTIMATE-ETH ESTRADIOL 0.25-35 MG-MCG PO TABS
1.0000 | ORAL_TABLET | Freq: Every day | ORAL | 3 refills | Status: DC
  Filled 2023-06-02: qty 84, 84d supply, fill #0
  Filled 2023-10-13: qty 84, 84d supply, fill #1
  Filled 2023-12-31: qty 84, 84d supply, fill #2
  Filled 2024-03-24: qty 84, 84d supply, fill #3

## 2023-08-05 ENCOUNTER — Other Ambulatory Visit (HOSPITAL_COMMUNITY): Payer: Self-pay

## 2023-08-05 ENCOUNTER — Encounter: Payer: Self-pay | Admitting: Pharmacist

## 2023-08-05 ENCOUNTER — Other Ambulatory Visit: Payer: Self-pay

## 2023-08-05 ENCOUNTER — Other Ambulatory Visit: Payer: Self-pay | Admitting: Physician Assistant

## 2023-08-05 MED ORDER — SERTRALINE HCL 50 MG PO TABS
50.0000 mg | ORAL_TABLET | Freq: Every day | ORAL | 5 refills | Status: DC
  Filled 2023-08-05: qty 30, 30d supply, fill #0
  Filled 2023-09-14: qty 30, 30d supply, fill #1
  Filled 2023-10-13: qty 30, 30d supply, fill #2
  Filled 2023-11-10: qty 30, 30d supply, fill #3
  Filled 2023-12-17: qty 30, 30d supply, fill #4
  Filled 2024-01-12: qty 30, 30d supply, fill #5

## 2023-08-10 ENCOUNTER — Other Ambulatory Visit: Payer: Self-pay

## 2023-09-15 ENCOUNTER — Other Ambulatory Visit: Payer: Self-pay

## 2023-10-13 ENCOUNTER — Other Ambulatory Visit: Payer: Self-pay

## 2023-11-10 ENCOUNTER — Other Ambulatory Visit (HOSPITAL_COMMUNITY): Payer: Self-pay

## 2023-12-17 ENCOUNTER — Other Ambulatory Visit (HOSPITAL_COMMUNITY): Payer: Self-pay

## 2023-12-29 ENCOUNTER — Ambulatory Visit (INDEPENDENT_AMBULATORY_CARE_PROVIDER_SITE_OTHER): Payer: 59 | Admitting: Physician Assistant

## 2023-12-29 ENCOUNTER — Other Ambulatory Visit (HOSPITAL_COMMUNITY): Payer: Self-pay

## 2023-12-29 ENCOUNTER — Encounter: Payer: Self-pay | Admitting: Physician Assistant

## 2023-12-29 VITALS — BP 126/84 | HR 72 | Temp 97.9°F | Ht 70.0 in | Wt 198.4 lb

## 2023-12-29 DIAGNOSIS — Z0000 Encounter for general adult medical examination without abnormal findings: Secondary | ICD-10-CM

## 2023-12-29 DIAGNOSIS — Z13220 Encounter for screening for lipoid disorders: Secondary | ICD-10-CM | POA: Diagnosis not present

## 2023-12-29 DIAGNOSIS — F411 Generalized anxiety disorder: Secondary | ICD-10-CM | POA: Diagnosis not present

## 2023-12-29 DIAGNOSIS — Z136 Encounter for screening for cardiovascular disorders: Secondary | ICD-10-CM

## 2023-12-29 MED ORDER — ALPRAZOLAM 0.25 MG PO TABS
0.2500 mg | ORAL_TABLET | Freq: Two times a day (BID) | ORAL | 0 refills | Status: AC | PRN
  Filled 2023-12-29: qty 20, 10d supply, fill #0

## 2023-12-29 NOTE — Progress Notes (Signed)
 Subjective:    Haley Levine is a 33 y.o. female and is here for a comprehensive physical exam.  HPI  Health Maintenance Due  Topic Date Due   Cervical Cancer Screening (HPV/Pap Cotest)  05/11/2023    Acute Concerns: None.   Chronic Issues: Anxiety Pt is on Zoloft 50 mg once daily.  Good compliance and tolerance.  Has been taking for about 10 years with no adverse side effects.  She will be going to [place] soon and has some anxiety about being away from her child for about 1 week.  She states this would be the longest she has ever been away from her toddler.   Health Maintenance: Immunizations -- UTD. Colonoscopy -- N/A Mammogram -- N/A PAP -- Per EHR pap was last done 05/10/20. Results were NILM. Pt follows up with OBGYN annually and states her pap is UTD.  Bone Density -- N/A Diet -- Overall healthy diet.  Exercise -- Walking in the mornings. Started in the last few months.   Sleep habits -- No concerns.  Mood -- Stable.  UTD with dentist? - Yes.  UTD with eye doctor? - No.   Weight history: Wt Readings from Last 10 Encounters:  12/29/23 198 lb 6.1 oz (90 kg)  12/10/22 200 lb 6.4 oz (90.9 kg)  10/28/21 191 lb 8 oz (86.9 kg)  09/24/21 190 lb 6.4 oz (86.4 kg)  07/01/21 188 lb (85.3 kg)  11/26/20 204 lb 9.4 oz (92.8 kg)  04/25/20 188 lb (85.3 kg)  12/14/19 195 lb 4 oz (88.6 kg)   Body mass index is 28.46 kg/m. Patient's last menstrual period was 12/29/2023 (exact date).  Alcohol use:  reports current alcohol use.  Tobacco use:  Tobacco Use: Low Risk  (12/29/2023)   Patient History    Smoking Tobacco Use: Never    Smokeless Tobacco Use: Never    Passive Exposure: Not on file   Eligible for lung cancer screening? no     12/29/2023    2:43 PM  Depression screen PHQ 2/9  Decreased Interest 0  Down, Depressed, Hopeless 0  PHQ - 2 Score 0     Other providers/specialists: Patient Care Team: Jarold Motto, [place] as PCP - General (Physician  Assistant)    PMHx, SurgHx, SocialHx, Medications, and Allergies were reviewed in the Visit Navigator and updated as appropriate.   Past Medical History:  Diagnosis Date   Anxiety    GERD (gastroesophageal reflux disease)      Past Surgical History:  Procedure Laterality Date   BREAST REDUCTION SURGERY Bilateral 2012   COSMETIC SURGERY  2012   WISDOM TOOTH EXTRACTION       Family History  Problem Relation Age of Onset   Osteoarthritis Mother    Hypertension Mother    Anxiety disorder Mother    Cancer Father        rare brain tumor   Osteoarthritis Father    Hyperlipidemia Father    Cancer Maternal Grandfather        pancreatic   Cancer Paternal Grandmother        colon   Breast cancer Paternal Grandmother 59   Cancer Paternal Grandfather        lung    Social History   Tobacco Use   Smoking status: Never   Smokeless tobacco: Never  Vaping Use   Vaping status: Never Used  Substance Use Topics   Alcohol use: Yes    Comment: Occasionally   Drug use: Never  Review of Systems:   Review of Systems  Constitutional:  Negative for chills, fever, malaise/fatigue and weight loss.  HENT:  Negative for hearing loss, sinus pain and sore throat.   Respiratory:  Negative for cough and hemoptysis.   Cardiovascular:  Negative for chest pain, palpitations, leg swelling and PND.  Gastrointestinal:  Negative for abdominal pain, constipation, diarrhea, heartburn, nausea and vomiting.  Genitourinary:  Negative for dysuria, frequency and urgency.  Musculoskeletal:  Negative for back pain, myalgias and neck pain.  Skin:  Negative for itching and rash.  Neurological:  Negative for dizziness, tingling, seizures and headaches.  Endo/Heme/Allergies:  Negative for polydipsia.  Psychiatric/Behavioral:  Negative for depression. The patient is not nervous/anxious.      Objective:   BP 126/84 (BP Location: Left Arm, Patient Position: Sitting, Cuff Size: Normal)   Pulse 72    Temp 97.9 F (36.6 C) (Temporal)   Ht 5\' 10"  (1.778 m)   Wt 198 lb 6.1 oz (90 kg)   LMP 12/29/2023 (Exact Date)   SpO2 99%   BMI 28.46 kg/m  Body mass index is 28.46 kg/m.   General Appearance:    Alert, cooperative, no distress, appears stated age  Head:    Normocephalic, without obvious abnormality, atraumatic  Eyes:    PERRL, conjunctiva/corneas clear, EOM's intact, fundi    benign, both eyes  Ears:    Normal TM's and external ear canals, both ears  Nose:   Nares normal, septum midline, mucosa normal, no drainage    or sinus tenderness  Throat:   Lips, mucosa, and tongue normal; teeth and gums normal  Neck:   Supple, symmetrical, trachea midline, no adenopathy;    thyroid:  no enlargement/tenderness/nodules; no carotid   bruit or JVD  Back:     Symmetric, no curvature, ROM normal, no CVA tenderness  Lungs:     Clear to auscultation bilaterally, respirations unlabored  Chest Wall:    No tenderness or deformity   Heart:    Regular rate and rhythm, S1 and S2 normal, no murmur, rub or gallop  Breast Exam:    Deferred  Abdomen:     Soft, non-tender, bowel sounds active all four quadrants,    no masses, no organomegaly  Genitalia:    Deferred  Extremities:   Extremities normal, atraumatic, no cyanosis or edema  Pulses:   2+ and symmetric all extremities  Skin:   Skin color, texture, turgor normal, no rashes or lesions  Lymph nodes:   Cervical, supraclavicular, and axillary nodes normal  Neurologic:   CNII-XII intact, normal strength, sensation and reflexes    throughout    Assessment/Plan:   Routine physical examination Today patient counseled on age appropriate routine health concerns for screening and prevention, each reviewed and up to date or declined. Immunizations reviewed and up to date or declined. Labs ordered and reviewed. Risk factors for depression reviewed and negative. Hearing function and visual acuity are intact. ADLs screened and addressed as needed. Functional  ability and level of safety reviewed and appropriate. Education, counseling and referrals performed based on assessed risks today. Patient provided with a copy of personalized plan for preventive services.  Generalized anxiety disorder Well controlled Continue Zoloft 50 mg daily Will provide short script of xanax 0.25 mg twice daily as needed for travel anxiety Follow up in 1 year, sooner if concerns  Encounter for lipid screening for cardiovascular disease Update lipid panel and make recommendations   I, Isabelle Course, acting as a scribe for  Jarold Motto, PA., have documented all relevant documentation on the behalf of Jarold Motto, [place], as directed by  Jarold Motto, PA while in the presence of Jarold Motto, [place].  I, Jarold Motto, [place], have reviewed all documentation for this visit. The documentation on 12/29/23 for the exam, diagnosis, procedures, and orders are all accurate and complete.  Jarold Motto, PA-C [facility]

## 2023-12-30 ENCOUNTER — Encounter: Payer: Self-pay | Admitting: Physician Assistant

## 2023-12-30 LAB — LIPID PANEL
Cholesterol: 191 mg/dL (ref 0–200)
HDL: 60.5 mg/dL (ref >39.00)
LDL Cholesterol: 98 mg/dL (ref 0–99)
NonHDL: 130.71
Total CHOL/HDL Ratio: 3
Triglycerides: 164 mg/dL — ABNORMAL HIGH (ref 0.0–149.0)
VLDL: 32.8 mg/dL (ref 0.0–40.0)

## 2023-12-30 LAB — CBC WITH DIFFERENTIAL/PLATELET
Basophils Absolute: 0 10*3/uL (ref 0.0–0.1)
Basophils Relative: 0.6 % (ref 0.0–3.0)
Eosinophils Absolute: 0.1 10*3/uL (ref 0.0–0.7)
Eosinophils Relative: 0.8 % (ref 0.0–5.0)
HCT: 39.8 % (ref 36.0–46.0)
Hemoglobin: 13.2 g/dL (ref 12.0–15.0)
Lymphocytes Relative: 40.6 % (ref 12.0–46.0)
Lymphs Abs: 2.8 10*3/uL (ref 0.7–4.0)
MCHC: 33.3 g/dL (ref 30.0–36.0)
MCV: 93.1 fl (ref 78.0–100.0)
Monocytes Absolute: 0.5 10*3/uL (ref 0.1–1.0)
Monocytes Relative: 6.6 % (ref 3.0–12.0)
Neutro Abs: 3.5 10*3/uL (ref 1.4–7.7)
Neutrophils Relative %: 51.4 % (ref 43.0–77.0)
Platelets: 188 10*3/uL (ref 150.0–400.0)
RBC: 4.27 Mil/uL (ref 3.87–5.11)
RDW: 12.8 % (ref 11.5–15.5)
WBC: 6.8 10*3/uL (ref 4.0–10.5)

## 2023-12-30 LAB — COMPREHENSIVE METABOLIC PANEL
ALT: 14 U/L (ref 0–35)
AST: 16 U/L (ref 0–37)
Albumin: 4.5 g/dL (ref 3.5–5.2)
Alkaline Phosphatase: 42 U/L (ref 39–117)
BUN: 12 mg/dL (ref 6–23)
CO2: 29 meq/L (ref 19–32)
Calcium: 9.6 mg/dL (ref 8.4–10.5)
Chloride: 103 meq/L (ref 96–112)
Creatinine, Ser: 0.81 mg/dL (ref 0.40–1.20)
GFR: 96.12 mL/min (ref >60.00)
Glucose, Bld: 87 mg/dL (ref 70–99)
Potassium: 4 meq/L (ref 3.5–5.1)
Sodium: 138 meq/L (ref 135–145)
Total Bilirubin: 0.3 mg/dL (ref 0.2–1.2)
Total Protein: 6.9 g/dL (ref 6.0–8.3)

## 2023-12-31 ENCOUNTER — Other Ambulatory Visit: Payer: Self-pay

## 2024-01-12 ENCOUNTER — Other Ambulatory Visit (HOSPITAL_COMMUNITY): Payer: Self-pay

## 2024-02-09 ENCOUNTER — Other Ambulatory Visit (HOSPITAL_COMMUNITY): Payer: Self-pay

## 2024-02-09 ENCOUNTER — Other Ambulatory Visit: Payer: Self-pay

## 2024-02-09 ENCOUNTER — Other Ambulatory Visit: Payer: Self-pay | Admitting: Physician Assistant

## 2024-02-09 MED ORDER — SERTRALINE HCL 50 MG PO TABS
50.0000 mg | ORAL_TABLET | Freq: Every day | ORAL | 5 refills | Status: DC
  Filled 2024-02-09: qty 30, 30d supply, fill #0
  Filled 2024-03-22: qty 30, 30d supply, fill #1
  Filled 2024-04-24: qty 30, 30d supply, fill #2
  Filled 2024-05-20: qty 30, 30d supply, fill #3
  Filled 2024-06-30: qty 30, 30d supply, fill #4
  Filled 2024-08-04: qty 30, 30d supply, fill #5

## 2024-03-23 ENCOUNTER — Other Ambulatory Visit: Payer: Self-pay

## 2024-03-24 ENCOUNTER — Other Ambulatory Visit: Payer: Self-pay

## 2024-04-25 ENCOUNTER — Other Ambulatory Visit: Payer: Self-pay

## 2024-05-20 ENCOUNTER — Other Ambulatory Visit (HOSPITAL_COMMUNITY): Payer: Self-pay

## 2024-06-30 ENCOUNTER — Other Ambulatory Visit: Payer: Self-pay

## 2024-06-30 ENCOUNTER — Other Ambulatory Visit (HOSPITAL_COMMUNITY): Payer: Self-pay

## 2024-06-30 MED ORDER — NORGESTIMATE-ETH ESTRADIOL 0.25-35 MG-MCG PO TABS
1.0000 | ORAL_TABLET | Freq: Every day | ORAL | 0 refills | Status: AC
  Filled 2024-06-30: qty 84, 84d supply, fill #0

## 2024-08-04 ENCOUNTER — Other Ambulatory Visit (HOSPITAL_COMMUNITY): Payer: Self-pay

## 2024-08-22 ENCOUNTER — Encounter: Payer: Self-pay | Admitting: Physician Assistant

## 2024-08-23 ENCOUNTER — Other Ambulatory Visit: Payer: Self-pay | Admitting: Physician Assistant

## 2024-08-23 ENCOUNTER — Other Ambulatory Visit (HOSPITAL_COMMUNITY): Payer: Self-pay

## 2024-08-23 MED ORDER — SERTRALINE HCL 50 MG PO TABS
75.0000 mg | ORAL_TABLET | Freq: Every day | ORAL | 3 refills | Status: DC
  Filled 2024-08-23 – 2024-09-01 (×3): qty 45, 30d supply, fill #0
  Filled 2024-09-05 – 2024-09-27 (×2): qty 45, 30d supply, fill #1
  Filled 2024-11-09: qty 45, 30d supply, fill #2

## 2024-09-02 ENCOUNTER — Other Ambulatory Visit (HOSPITAL_BASED_OUTPATIENT_CLINIC_OR_DEPARTMENT_OTHER): Payer: Self-pay

## 2024-09-02 ENCOUNTER — Other Ambulatory Visit: Payer: Self-pay

## 2024-09-05 ENCOUNTER — Other Ambulatory Visit (HOSPITAL_COMMUNITY): Payer: Self-pay

## 2024-09-05 MED ORDER — PROMETHAZINE HCL 25 MG PO TABS
25.0000 mg | ORAL_TABLET | Freq: Four times a day (QID) | ORAL | 0 refills | Status: AC | PRN
  Filled 2024-09-05: qty 30, 8d supply, fill #0

## 2024-09-22 DIAGNOSIS — N911 Secondary amenorrhea: Secondary | ICD-10-CM | POA: Diagnosis not present

## 2024-09-27 ENCOUNTER — Other Ambulatory Visit (HOSPITAL_COMMUNITY): Payer: Self-pay

## 2024-09-30 ENCOUNTER — Other Ambulatory Visit (HOSPITAL_COMMUNITY): Payer: Self-pay

## 2024-09-30 MED ORDER — ONDANSETRON 4 MG PO TBDP
4.0000 mg | ORAL_TABLET | Freq: Four times a day (QID) | ORAL | 1 refills | Status: AC | PRN
  Filled 2024-09-30: qty 30, 8d supply, fill #0

## 2024-10-03 DIAGNOSIS — Z3481 Encounter for supervision of other normal pregnancy, first trimester: Secondary | ICD-10-CM | POA: Diagnosis not present

## 2024-10-03 DIAGNOSIS — Z3A11 11 weeks gestation of pregnancy: Secondary | ICD-10-CM | POA: Diagnosis not present

## 2024-10-03 DIAGNOSIS — Z3400 Encounter for supervision of normal first pregnancy, unspecified trimester: Secondary | ICD-10-CM | POA: Diagnosis not present

## 2024-10-10 DIAGNOSIS — Z113 Encounter for screening for infections with a predominantly sexual mode of transmission: Secondary | ICD-10-CM | POA: Diagnosis not present

## 2024-10-10 DIAGNOSIS — Z3400 Encounter for supervision of normal first pregnancy, unspecified trimester: Secondary | ICD-10-CM | POA: Diagnosis not present

## 2024-10-10 DIAGNOSIS — Z3A12 12 weeks gestation of pregnancy: Secondary | ICD-10-CM | POA: Diagnosis not present

## 2024-11-09 ENCOUNTER — Other Ambulatory Visit: Payer: Self-pay

## 2024-11-17 ENCOUNTER — Encounter: Payer: Self-pay | Admitting: Physician Assistant

## 2024-11-17 ENCOUNTER — Other Ambulatory Visit: Payer: Self-pay | Admitting: Physician Assistant

## 2024-11-17 ENCOUNTER — Other Ambulatory Visit (HOSPITAL_COMMUNITY): Payer: Self-pay

## 2024-11-18 ENCOUNTER — Other Ambulatory Visit (HOSPITAL_COMMUNITY): Payer: Self-pay

## 2024-11-18 MED ORDER — SERTRALINE HCL 50 MG PO TABS
75.0000 mg | ORAL_TABLET | Freq: Every day | ORAL | 0 refills | Status: AC
  Filled 2024-11-18: qty 135, 90d supply, fill #0

## 2025-01-10 ENCOUNTER — Encounter: Admitting: Physician Assistant
